# Patient Record
Sex: Female | Born: 1950 | Race: White | Hispanic: No | Marital: Married | Smoking: Never smoker
Health system: Southern US, Community
[De-identification: ages and names within clinical notes are randomized; demographics above are authoritative.]

## PROBLEM LIST (undated history)

## (undated) DIAGNOSIS — K7689 Other specified diseases of liver: Secondary | ICD-10-CM

## (undated) DIAGNOSIS — E785 Hyperlipidemia, unspecified: Secondary | ICD-10-CM

## (undated) DIAGNOSIS — N83209 Unspecified ovarian cyst, unspecified side: Secondary | ICD-10-CM

## (undated) DIAGNOSIS — Z87442 Personal history of urinary calculi: Secondary | ICD-10-CM

## (undated) DIAGNOSIS — Z973 Presence of spectacles and contact lenses: Secondary | ICD-10-CM

## (undated) DIAGNOSIS — T8859XA Other complications of anesthesia, initial encounter: Secondary | ICD-10-CM

## (undated) DIAGNOSIS — I10 Essential (primary) hypertension: Secondary | ICD-10-CM

## (undated) DIAGNOSIS — M1990 Unspecified osteoarthritis, unspecified site: Secondary | ICD-10-CM

## (undated) DIAGNOSIS — K219 Gastro-esophageal reflux disease without esophagitis: Secondary | ICD-10-CM

## (undated) DIAGNOSIS — N200 Calculus of kidney: Secondary | ICD-10-CM

## (undated) HISTORY — PX: OTHER SURGICAL HISTORY: SHX169

## (undated) HISTORY — PX: TOTAL KNEE ARTHROPLASTY: SHX125

## (undated) HISTORY — PX: CATARACT EXTRACTION W/ INTRAOCULAR LENS  IMPLANT, BILATERAL: SHX1307

---

## 1990-02-20 HISTORY — PX: KNEE ARTHROSCOPY: SUR90

## 1998-02-20 HISTORY — PX: VAGINAL HYSTERECTOMY: SUR661

## 1998-03-18 ENCOUNTER — Inpatient Hospital Stay (HOSPITAL_COMMUNITY): Admission: RE | Admit: 1998-03-18 | Discharge: 1998-03-19 | Payer: Self-pay | Admitting: Obstetrics and Gynecology

## 2006-08-20 ENCOUNTER — Encounter: Admission: RE | Admit: 2006-08-20 | Discharge: 2006-08-20 | Payer: Self-pay | Admitting: Anesthesiology

## 2006-08-20 ENCOUNTER — Encounter: Admission: RE | Admit: 2006-08-20 | Discharge: 2006-08-20 | Payer: Self-pay | Admitting: Obstetrics and Gynecology

## 2007-08-22 ENCOUNTER — Ambulatory Visit: Payer: Self-pay | Admitting: Internal Medicine

## 2008-10-20 ENCOUNTER — Ambulatory Visit: Payer: Self-pay | Admitting: Internal Medicine

## 2009-10-27 ENCOUNTER — Ambulatory Visit: Payer: Self-pay | Admitting: Internal Medicine

## 2010-03-13 ENCOUNTER — Encounter: Payer: Self-pay | Admitting: Podiatry

## 2010-10-10 ENCOUNTER — Encounter (HOSPITAL_BASED_OUTPATIENT_CLINIC_OR_DEPARTMENT_OTHER)
Admission: RE | Admit: 2010-10-10 | Discharge: 2010-10-10 | Disposition: A | Discharge status: Home / Self Care | Payer: BC Managed Care – PPO | Source: Ambulatory Visit | Attending: Orthopedic Surgery | Admitting: Orthopedic Surgery

## 2010-10-10 LAB — BASIC METABOLIC PANEL
BUN: 16 mg/dL (ref 6–23)
CO2: 30 mEq/L (ref 19–32)
GFR calc non Af Amer: >60 mL/min (ref >60)

## 2010-10-11 ENCOUNTER — Ambulatory Visit (HOSPITAL_BASED_OUTPATIENT_CLINIC_OR_DEPARTMENT_OTHER)
Admission: RE | Admit: 2010-10-11 | Discharge: 2010-10-11 | Disposition: A | Discharge status: Home / Self Care | Payer: BC Managed Care – PPO | Source: Ambulatory Visit | Attending: Orthopedic Surgery | Admitting: Orthopedic Surgery

## 2010-10-11 DIAGNOSIS — Z01810 Encounter for preprocedural cardiovascular examination: Secondary | ICD-10-CM | POA: Insufficient documentation

## 2010-10-11 DIAGNOSIS — X58XXXS Exposure to other specified factors, sequela: Secondary | ICD-10-CM | POA: Insufficient documentation

## 2010-10-11 DIAGNOSIS — IMO0002 Reserved for concepts with insufficient information to code with codable children: Secondary | ICD-10-CM | POA: Insufficient documentation

## 2010-10-11 DIAGNOSIS — T148XXS Other injury of unspecified body region, sequela: Secondary | ICD-10-CM | POA: Insufficient documentation

## 2010-10-11 DIAGNOSIS — L608 Other nail disorders: Secondary | ICD-10-CM | POA: Insufficient documentation

## 2010-10-11 DIAGNOSIS — Z01812 Encounter for preprocedural laboratory examination: Secondary | ICD-10-CM | POA: Insufficient documentation

## 2010-10-11 DIAGNOSIS — I10 Essential (primary) hypertension: Secondary | ICD-10-CM | POA: Insufficient documentation

## 2010-10-11 LAB — POCT HEMOGLOBIN-HEMACUE: Hemoglobin: 15.8 g/dL — ABNORMAL HIGH (ref 12.0–15.0)

## 2010-11-15 ENCOUNTER — Ambulatory Visit: Payer: Self-pay | Admitting: Internal Medicine

## 2010-11-22 NOTE — Op Note (Signed)
  NAME:  Lauren Carter, FLOOR NO.:  [id number]  MEDICAL RECORD NO.:  [id number]  LOCATION:                                 FACILITY:  PHYSICIAN:  Cindee Salt, M.D.            DATE OF BIRTH:  DATE OF PROCEDURE: DATE OF DISCHARGE:                              OPERATIVE REPORT   PREOPERATIVE DIAGNOSIS:  Lesion, left index finger nail bed.  POSTOPERATIVE DIAGNOSIS:  Lesion, left index finger nail bed.  OPERATION:  Excision, mass reconstruction on nail bed, left index finger.  SURGEON:  Cindee Salt, MD  ASSISTANT:  [name] Loa, MD  ANESTHESIA:  Forearm based IV regional.  DATE OF OPERATION:  October 11, 2010  ANESTHESIOLOGIST:  Janetta Hora. Frederick, MD  HISTORY:  The patient is a 60 year old female who suffered a crush injury to her left index finger approximately 7 months ago.  This has not healed.  She has a split nail.  She is desirous of attempted reconstruction with a area of growth through the central aspect looking like pyogenic granuloma.  Pre, peri, and postoperative course have been discussed along with risks and complications.  She is aware there is no guarantee with surgery, possibility of infection, recurrence of injury to arteries, nerves, tendons, incomplete relief of symptoms, continued deformity to the nail plate with overlapping or splitting.  In preoperative area, the patient was seen.  The extremity was marked by both the patient and surgeon.  Antibiotic given.  PROCEDURE:  The patient was brought to the operating room where a forearm based IV regional anesthetic was carried out without difficulty. She was prepped using ChloraPrep, supine position with the left arm free.  A 3-minute dry time was allowed.  Time-out taken confirming the patient procedure.  The remainder of the nail plate was removed.  The area of darkened pyogenic type tissue was excised.  This was a long ellipse covering the entire nail matrix.  The area was then  undermined, irrigated.  This was then closed with interrupted 6-0 chromic sutures on complete closure of the wound.  A sterile gauze was placed into the nail fold along with a second over the top.  A sterile compressive dressing splint to the finger was applied.  Deflation of the tourniquet. Remaining fingers pinked.  Prior to application of the dressing, a metacarpal block was given.  A 0.25% Marcaine without epinephrine of approximately 8 mL was used.  The patient tolerated the procedure well and was taken to the recovery room for observation in satisfactory condition.  She will be discharged home to return to the [facility] in 1 week on Vicodin.          ______________________________ Cindee Salt, M.D.     GK/MEDQ  D:  10/11/2010  T:  10/11/2010  Job:  [id number]  Electronically Signed by Cindee Salt M.D. on 11/22/2010 04:38:21 PM

## 2011-11-16 ENCOUNTER — Ambulatory Visit: Payer: Self-pay | Admitting: Internal Medicine

## 2012-11-18 ENCOUNTER — Ambulatory Visit: Payer: Self-pay | Admitting: Internal Medicine

## 2013-11-24 ENCOUNTER — Ambulatory Visit: Payer: Self-pay | Admitting: Family Medicine

## 2014-03-18 ENCOUNTER — Ambulatory Visit: Payer: Self-pay | Admitting: Family Medicine

## 2014-04-01 ENCOUNTER — Ambulatory Visit: Payer: Self-pay | Admitting: Surgery

## 2014-04-14 ENCOUNTER — Inpatient Hospital Stay: Payer: Self-pay | Admitting: Surgery

## 2014-06-03 ENCOUNTER — Ambulatory Visit
Admit: 2014-06-03 | Disposition: A | Discharge status: Home / Self Care | Payer: Self-pay | Attending: Surgery | Admitting: Surgery

## 2014-06-21 NOTE — Op Note (Signed)
PATIENT NAME:  Lauren Carter, Lauren Carter MR#:  [id number] DATE OF BIRTH:  11-25-1950  DATE OF PROCEDURE:  04/14/2014  PREOPERATIVE DIAGNOSIS: Degenerative joint disease, left knee.   POSTOPERATIVE DIAGNOSIS: Degenerative joint disease, left knee.   PROCEDURE: Left total knee arthroplasty using an all-cemented Biomet Vanguard system with a 65 mm posterior cruciate retaining femoral component, a 67 mm tibial tray with a 10 mm E-polyethylene insert, and a 9 x 34 mm all-polyethylene three-pegged domed patella.   SURGEON: Pascal Lux, M.D.   ASSISTANT: Francena Hanly, NP  ANESTHESIA: Spinal.   FINDINGS: As noted above.   COMPLICATIONS: None.   ESTIMATED BLOOD LOSS: Less than 25 mL.  TOTAL FLUIDS: 1800 mL of crystalloid.   URINE OUTPUT: 100 mL.  TOURNIQUET TIME: 103 minutes at 300 mmHg.   DRAINS: None.   CLOSURE: Staples.   BRIEF CLINICAL NOTE: The patient is a 64 year old female with a several year history of progressively worsening left knee pain. Her symptoms have persisted despite medications, activity modification, injections, etc. Her history and examination are consistent with degenerative joint disease, confirmed by plain radiographs. She presents at this time for a left total knee arthroplasty.   PROCEDURE IN DETAIL: The patient was brought into the operating room. After adequate spinal anesthesia was obtained, she was lain in the supine position and a Foley catheter inserted. Her left lower extremity was prepped with ChloraPrep solution before being draped sterilely. Preoperative antibiotics were administered. The limb was exsanguinated with an Esmarch and the tourniquet inflated to 300 mmHg. A standard anterior approach to the knee was made through an approximately 6 to 7 inch incision. The incision was carried down through the subcutaneous tissues to expose the superficial retinaculum. This was split the length of the incision and the flap elevated sufficiently to expose the medial  retinaculum. The retinaculum was incised, leaving a 3 to 4 mm cuff of tissue along the medial border of the patella. This was extended distally along the medial border of the patellar tendon and proximally through the medial third of the quadriceps tendon. A subtotal fat pad excision was performed before the anterior portions of the medial and lateral menisci were removed, as was the anterior cruciate ligament. The soft tissues were elevated off the anteromedial aspect of the proximal tibia to the level of the collateral ligaments. With the knee flexed at 90 degrees, the external tibial guide was positioned and the appropriate tibial cut made. The piece was taken to the back table where it was measured and found to be optimally replicated by a 67 mm component.   Attention was directed to the distal femur. The intramedullary canal was accessed through a 3/8 inch drill hole. The intramedullary guide was positioned to ensure a neutral flexion gap. The anterior cutting guide was positioned and, after verifying that the anterior cortex would not be notched, the appropriate anterior cut was made. The distal cutting block was positioned, incorporating 6 degrees of valgus alignment. The distal femoral cut was made. The distal femur was measured and found to be optimally replicated by a 65 mm component. Therefore, the 65 mm 4-in-1 cutting block was positioned in first the posterior, then the posterior chamfer, the anterior chamfer, and finally the anterior cuts were made. At this point, the posterior portions of the medial and lateral menisci were removed.   A trial reduction was performed using the 65 mm posterior cruciate retaining femoral component, the 67 mm tibial tray, and the 10 mm insert. The 10 mm  insert demonstrated excellent stability to varus and valgus stressing, both in flexion and extension, and permitted full extension. Patella tracking was assessed and found to be excellent, so the tibial tray was  temporarily secured using headed pins. The patellar thickness was measured and found to be 22 mm; therefore, a 9 mm cut was made. The surface was measured and found to be optimally replicated by a 34 mm component. Therefore, the three-pegged holes corresponding to the 34 mm insert were drilled, and the trial patellar button inserted. Patella tracking again was assessed and found to be excellent, easily passing the "no thumb test". The lug holes were drilled into the distal femur before the trial components were removed. The tibial tray was replaced by another tibial tray that would enable the cruciate punch to be used. This was performed to establish the appropriate keel for the tibial component.   The bony surfaces were prepared for cementing by irrigating them thoroughly with bacitracin saline solution via the jet lavage system, then packing them with a dry lap sponge. Prior to irrigation, a total of 20 mL of Exparel diluted out to 60 mL with normal saline and 30 mL of 0.5% Sensorcaine was injected into the posteromedial and posterolateral capsular regions, the medial and lateral gutter regions, and the peri-incisional tissues to help with postoperative analgesia. In addition, a plug was fashioned from some of the bone that had been removed previously and used to plug the distal femoral canal. Meanwhile, cement was being mixed on the back table. When the cement was ready, the tibial tray was cemented in first. The excess cement was removed using [employer] fast streamer. Next, the femoral component was impacted into place. Again, the excess cement was removed using [employer] fast streamer. The 10 mm trial insert was positioned and the knee brought into extension while the cement hardened. Finally, the patella was cemented into place and secured using the patellar clamp. Again, the excess cement was removed using [employer] fast streamer. Once the cement had hardened, the knee was placed through a range of motion with the findings as  described above. Therefore, the trial tibial tray was removed and, after verifying that no cement had been retained posteriorly, the permanent 10 mm E-polyethylene insert was positioned and secured using the appropriate key locking mechanism. Again, the knee was placed through a range of motion with the findings as described above.   The wound was copiously irrigated with bacitracin saline solution via the jet lavage system before the quadriceps tendon and retinacular layer were reapproximated using #0 Vicryl interrupted sutures. The superficial retinacular layer was closed using running #0 Vicryl suture. The subcutaneous tissues were closed in two layers using 2-0 Vicryl interrupted sutures. The skin was closed using staples. A sterile occlusive dressing was applied to the knee before the patient was awakened and returned to the recovery room in satisfactory condition after tolerating the procedure well.   Of note, 1 gram of tranexamic acid in 10 mL of normal saline was injected intra-articularly after closure of the retinacular layer to help with postoperative bleeding.   ____________________________ Lenna Sciara. Dorien Chihuahua, MD jjp:sb D: 04/14/2014 10:23:49 ET T: 04/14/2014 12:29:12 ET JOB#: [id number]  cc: Pascal Lux, MD, <Dictator> Pascal Lux MD ELECTRONICALLY SIGNED 04/14/2014 15:28

## 2014-06-21 NOTE — Discharge Summary (Signed)
PATIENT NAME:  Lauren Carter, Lauren Carter MR#:  [id number] DATE OF BIRTH:  [birth year]  DATE OF ADMISSION:  04/14/2014 DATE OF DISCHARGE:  04/17/2014  ADMITTING DIAGNOSIS: Degenerative arthrosis of the left knee.   DISCHARGE DIAGNOSIS: Degenerative arthrosis of the left knee.   OPERATION: On 04/14/2014, the patient had a left total knee arthroplasty using cemented Biomet Vanguard system with 65 mm posterior cruciate retaining femoral component, 67 mm tibial tray with 10 mm E polyethylene insert and 9 x 34 mm all polyethylene three-pegged dome patella.   SURGEON: Josefina Do, MD.  ASSISTANT: April Tretha Sciara, NP.  ANESTHESIA: Spinal.   COMPLICATIONS: None.   ESTIMATED BLOOD LOSS: 25 mL.  The patient was then stabilized, brought to the recovery room, and then brought down to the orthopedic floor.   HISTORY: The patient is a 64 year old female who presented for persistent pain involving the left knee being refractory to conservative treatment. The patient has tried an arthroscopy as well as cortisone injections with no relief.   PHYSICAL EXAMINATION: GENERAL: Alert female with an antalgic gait with slight limping component to the left side.  HEART: Regular rate and rhythm.  LUNGS: Clear to auscultation.  MUSCULOSKELETAL: In regard to the left knee, the patient has -3 degrees extension to 95 degrees flexion with pain with flexion. The patient is tender along the medial joint line. The patient has no instability of the knee. There is no effusion noted.   HOSPITAL COURSE: After initial admission on April 14, 2014, the patient was brought to the orthopedic floor. On postoperative day 1, the patient had a hemoglobin of 11.7 and then on postoperative day 2 the patient's hemoglobin was still at 11.7 with no transfusion given. The patient did physical therapy, initially bed to chair and progressed up to ambulating around the nurse's station, including doing stairs. The patient was ready to go home with home  health physical therapy on April 17, 2014.   CONDITION AT DISCHARGE: Stable.   DISPOSITION: The patient was sent home with home health physical therapy.   DISCHARGE INSTRUCTIONS: The patient will do weight bear as tolerated on the effected leg. The patient will use knee-high TED hose on both legs, removed at bedtime, and then elevate the heels off the bed as well as use incentive spirometer and be encouraged to do cough and deep breathing. The patient's diet is regular. The patient will use Polar Care to decrease swelling, try not to get her dressing dirty or wet. The patient will keep her dressing on unless changed by physical therapy. The patient will call the clinic if there is any bright red bleeding, calf pain, bowel or bladder difficulty, or any fever greater than 101.5. The patient will do home health physical therapy working on gait training and range of motion activities. The patient will follow up with [facility] in about 2 weeks for staple removal.   DISCHARGE MEDICATIONS: Resume home medications and then to add Tylenol 500 mg 1 tablet q. 4 hours as needed for fever or pain, oxycodone 5 mg 1 tablet q. 4 hours as needed for moderate to severe pain, and tramadol 50 mg 1 tablet every 8 hours or as needed for pain. The patient will also use Lovenox 40 mg once a day for 14 days and then discontinue and start an aspirin 81 mg once a day. The patient will also use bisacodyl 10 mg rectally or Senokot-S 1 tablet twice a day as needed for constipation.  ____________________________ Lenna Sciara. Reche Dixon, [place] jtm:sb  D: 04/17/2014 06:19:06 ET T: 04/17/2014 16:15:19 ET JOB#: [id number]  cc: J. Reche Dixon, [place], <Dictator> J Quentina Fronek [facility] PA ELECTRONICALLY SIGNED 04/20/2014 8:40

## 2014-09-11 ENCOUNTER — Other Ambulatory Visit
Admission: RE | Admit: 2014-09-11 | Discharge: 2014-09-11 | Disposition: A | Discharge status: Home / Self Care | Payer: BC Managed Care – PPO | Source: Ambulatory Visit | Attending: Family Medicine | Admitting: Family Medicine

## 2014-09-11 DIAGNOSIS — Z96652 Presence of left artificial knee joint: Secondary | ICD-10-CM | POA: Insufficient documentation

## 2014-09-11 LAB — SYNOVIAL CELL COUNT + DIFF, W/ CRYSTALS
Crystals, Fluid: NONE SEEN
Eosinophils-Synovial: 0 % (ref 0–1)
LYMPHOCYTES-SYNOVIAL FLD: 29 % — AB (ref 0–20)
Monocyte-Macrophage-Synovial Fluid: 19 % — ABNORMAL LOW (ref 50–90)
NEUTROPHIL, SYNOVIAL: 52 % — AB (ref 0–25)
Other Cells-SYN: 0
WBC, SYNOVIAL: 332 /mm3 — AB (ref 0–200)

## 2014-09-15 LAB — BODY FLUID CULTURE
Culture: NO GROWTH
Gram Stain: NONE SEEN

## 2014-11-05 ENCOUNTER — Other Ambulatory Visit: Payer: Self-pay | Admitting: Family Medicine

## 2014-11-05 DIAGNOSIS — Z1231 Encounter for screening mammogram for malignant neoplasm of breast: Secondary | ICD-10-CM

## 2014-11-26 ENCOUNTER — Ambulatory Visit: Payer: BC Managed Care – PPO

## 2014-11-30 ENCOUNTER — Ambulatory Visit
Admission: RE | Admit: 2014-11-30 | Discharge: 2014-11-30 | Disposition: A | Discharge status: Home / Self Care | Payer: BC Managed Care – PPO | Source: Ambulatory Visit | Attending: Family Medicine | Admitting: Family Medicine

## 2014-11-30 DIAGNOSIS — Z1231 Encounter for screening mammogram for malignant neoplasm of breast: Secondary | ICD-10-CM | POA: Insufficient documentation

## 2015-11-09 ENCOUNTER — Other Ambulatory Visit: Payer: Self-pay | Admitting: Family Medicine

## 2015-11-09 DIAGNOSIS — Z1231 Encounter for screening mammogram for malignant neoplasm of breast: Secondary | ICD-10-CM

## 2015-12-06 ENCOUNTER — Ambulatory Visit
Admission: RE | Admit: 2015-12-06 | Discharge: 2015-12-06 | Disposition: A | Discharge status: Home / Self Care | Payer: BC Managed Care – PPO | Source: Ambulatory Visit | Attending: Family Medicine | Admitting: Family Medicine

## 2015-12-06 ENCOUNTER — Other Ambulatory Visit: Payer: Self-pay | Admitting: Family Medicine

## 2015-12-06 DIAGNOSIS — Z1231 Encounter for screening mammogram for malignant neoplasm of breast: Secondary | ICD-10-CM | POA: Diagnosis present

## 2016-02-21 HISTORY — PX: HAMMER TOE SURGERY: SHX385

## 2016-07-24 ENCOUNTER — Other Ambulatory Visit: Payer: Self-pay | Admitting: Otolaryngology

## 2016-07-24 DIAGNOSIS — H9202 Otalgia, left ear: Secondary | ICD-10-CM

## 2016-07-24 DIAGNOSIS — H9041 Sensorineural hearing loss, unilateral, right ear, with unrestricted hearing on the contralateral side: Secondary | ICD-10-CM

## 2016-07-31 ENCOUNTER — Ambulatory Visit: Payer: BC Managed Care – PPO

## 2016-08-02 ENCOUNTER — Ambulatory Visit
Admission: RE | Admit: 2016-08-02 | Discharge: 2016-08-02 | Disposition: A | Discharge status: Home / Self Care | Payer: Medicare Other | Source: Ambulatory Visit | Attending: Otolaryngology | Admitting: Otolaryngology

## 2016-08-02 DIAGNOSIS — H9041 Sensorineural hearing loss, unilateral, right ear, with unrestricted hearing on the contralateral side: Secondary | ICD-10-CM | POA: Insufficient documentation

## 2016-08-02 DIAGNOSIS — H9202 Otalgia, left ear: Secondary | ICD-10-CM | POA: Diagnosis present

## 2016-08-02 LAB — POCT I-STAT CREATININE: CREATININE: 0.7 mg/dL (ref 0.44–1.00)

## 2016-08-02 MED ORDER — GADOBENATE DIMEGLUMINE 529 MG/ML IV SOLN
15.0000 mL | Freq: Once | INTRAVENOUS | Status: AC | PRN
  Administered 2016-08-02: 15 mL via INTRAVENOUS

## 2016-10-03 ENCOUNTER — Other Ambulatory Visit: Payer: Self-pay | Admitting: Family Medicine

## 2016-10-03 DIAGNOSIS — M545 Low back pain, unspecified: Secondary | ICD-10-CM

## 2016-10-03 DIAGNOSIS — R310 Gross hematuria: Secondary | ICD-10-CM

## 2016-10-04 ENCOUNTER — Ambulatory Visit
Admission: RE | Admit: 2016-10-04 | Discharge: 2016-10-04 | Disposition: A | Discharge status: Home / Self Care | Payer: Medicare Other | Source: Ambulatory Visit | Attending: Family Medicine | Admitting: Family Medicine

## 2016-10-04 DIAGNOSIS — N202 Calculus of kidney with calculus of ureter: Secondary | ICD-10-CM | POA: Diagnosis not present

## 2016-10-04 DIAGNOSIS — M545 Low back pain, unspecified: Secondary | ICD-10-CM

## 2016-10-04 DIAGNOSIS — N83201 Unspecified ovarian cyst, right side: Secondary | ICD-10-CM | POA: Diagnosis not present

## 2016-10-04 DIAGNOSIS — R310 Gross hematuria: Secondary | ICD-10-CM | POA: Diagnosis present

## 2016-10-04 HISTORY — DX: Essential (primary) hypertension: I10

## 2016-10-04 MED ORDER — IOPAMIDOL (ISOVUE-300) INJECTION 61%
100.0000 mL | Freq: Once | INTRAVENOUS | Status: AC | PRN
  Administered 2016-10-04: 100 mL via INTRAVENOUS

## 2016-10-26 IMAGING — MR MRI OF THE LEFT KNEE WITHOUT CONTRAST
6 series · 37 of 40 positions shown · non-contrast
Comparison: None.

CLINICAL DATA: Anterior and medial left knee pain for 1 month. No
known injury.

EXAM:
MRI OF THE LEFT KNEE WITHOUT CONTRAST
TECHNIQUE: Multiplanar, multisequence MR imaging of the knee was performed. No
intravenous contrast was administered.

[Series 3: PD fat-sat · axial · 3.0mm · 0.50mm/px · z∈[-47,+65]mm · 8 of 35 slices shown (1 of 4)]
[im 1/35]
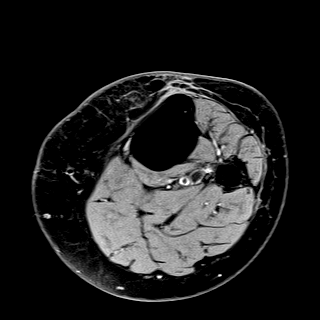
[im 4/35]
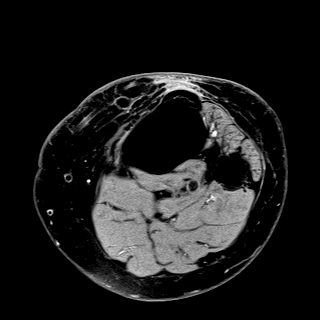
[im 12/35]
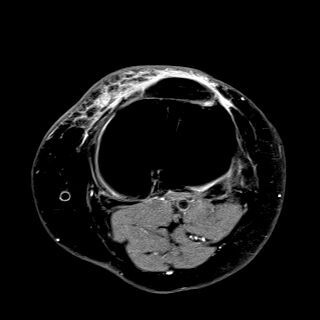
[im 16/35]
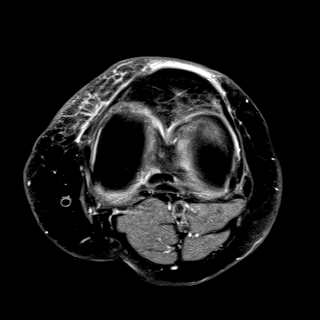
[im 19/35]
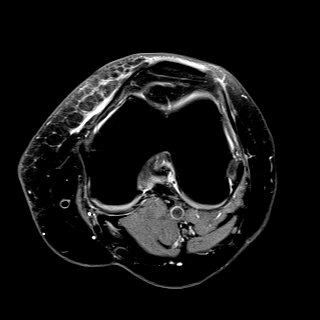
[im 23/35]
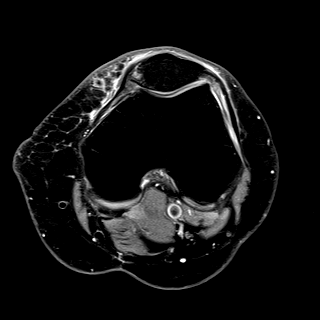
[im 31/35]
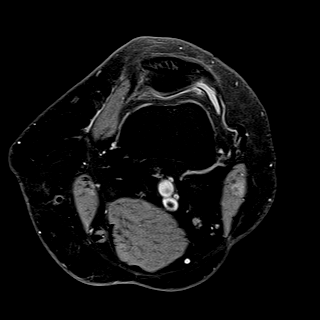
[im 35/35]
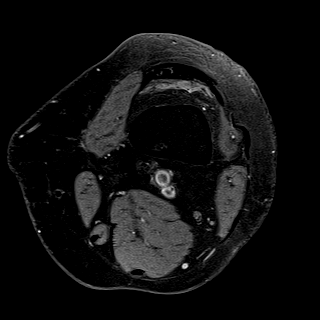

[Series 4: T1 · coronal · 3.0mm · 0.50mm/px · 6 of 29 slices shown]
[im 1/29]
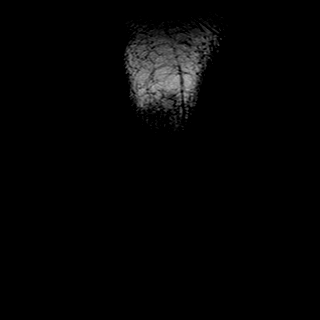
[im 5/29]
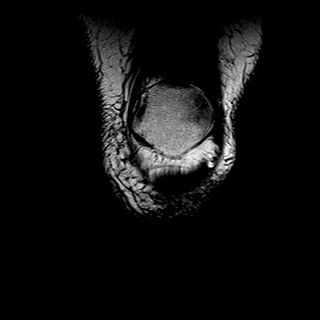
[im 10/29]
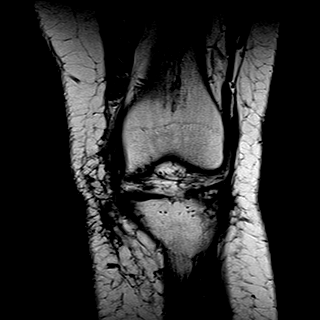
[im 15/29]
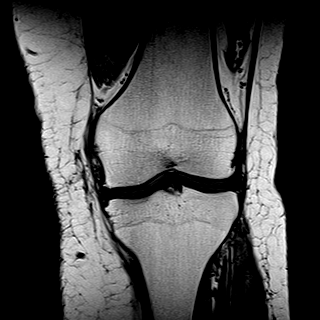
[im 19/29]
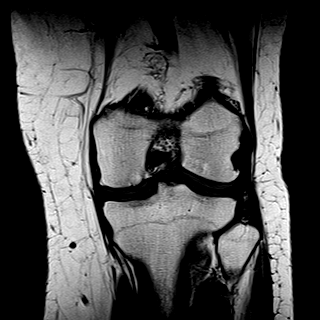
[im 24/29]
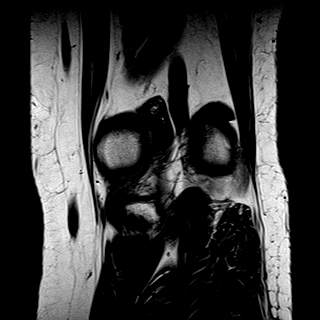

[Series 5: PD fat-sat · sagittal · 3.0mm · 0.50mm/px · 7 of 29 slices shown (2 of 4)]
[im 1/29]
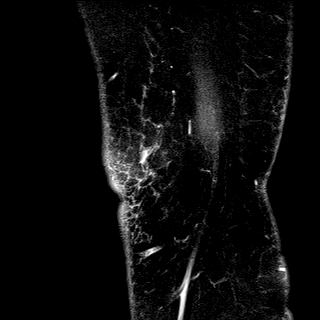
[im 5/29]
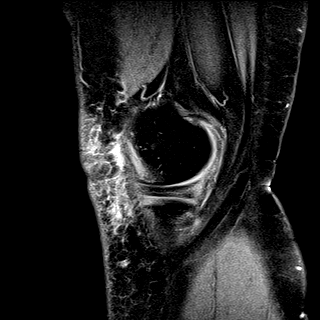
[im 10/29]
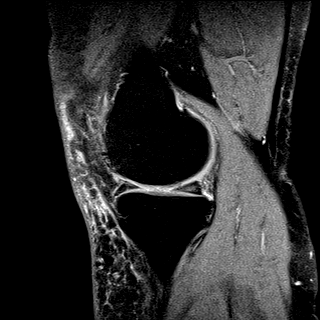
[im 15/29]
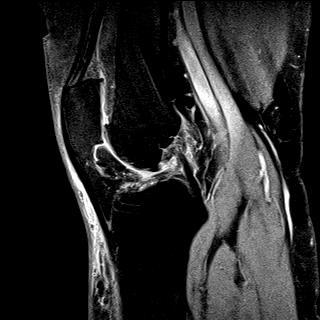
[im 19/29]
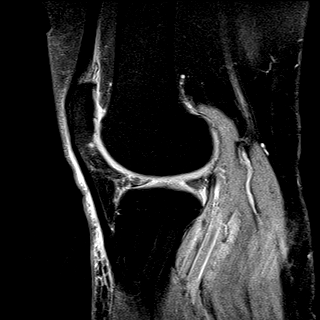
[im 24/29]
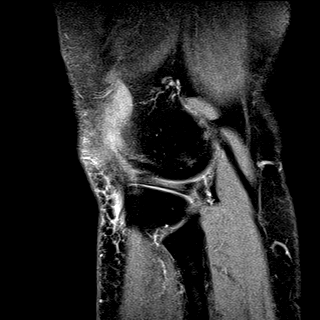
[im 29/29]
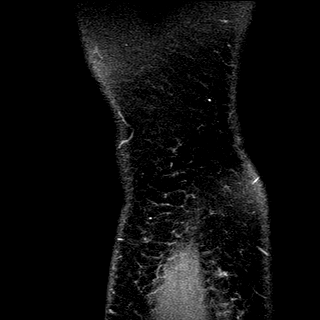

[Series 6: T2 fat-sat · coronal · 3.0mm · 0.31mm/px · 7 of 29 slices shown]
[im 1/29]
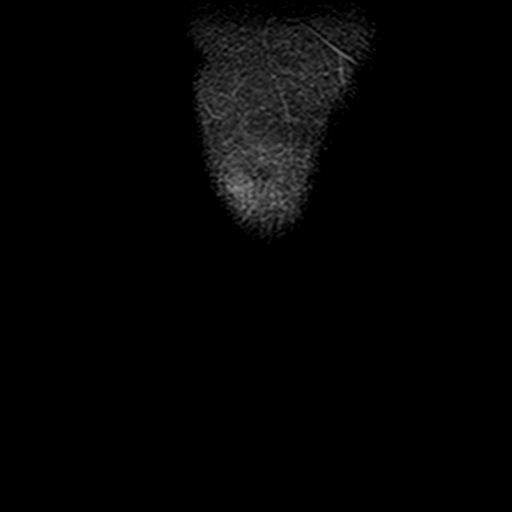
[im 5/29]
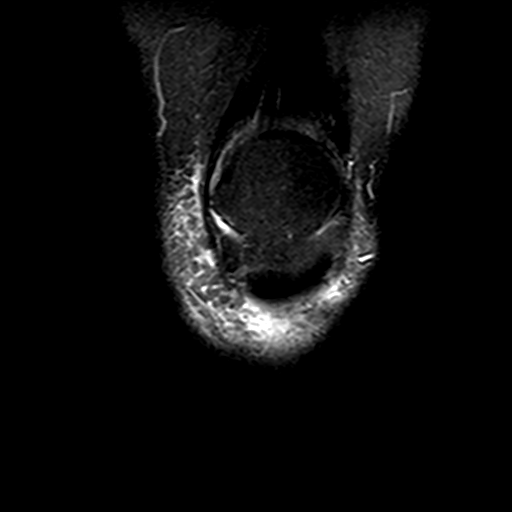
[im 10/29]
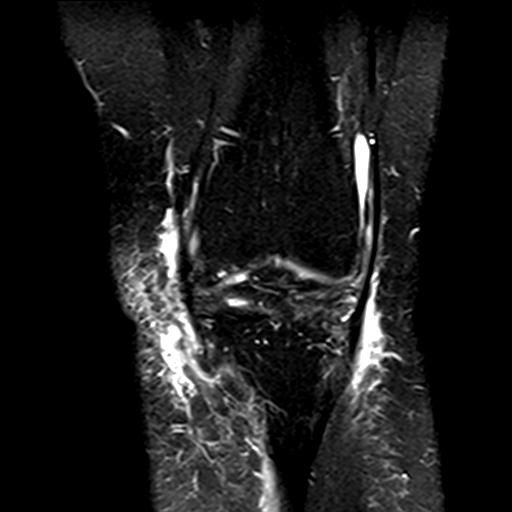
[im 15/29]
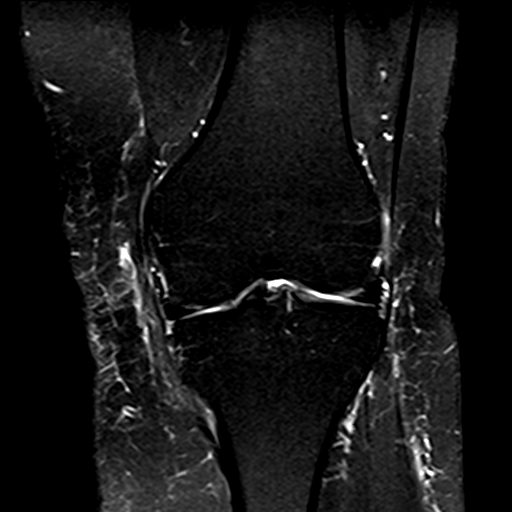
[im 19/29]
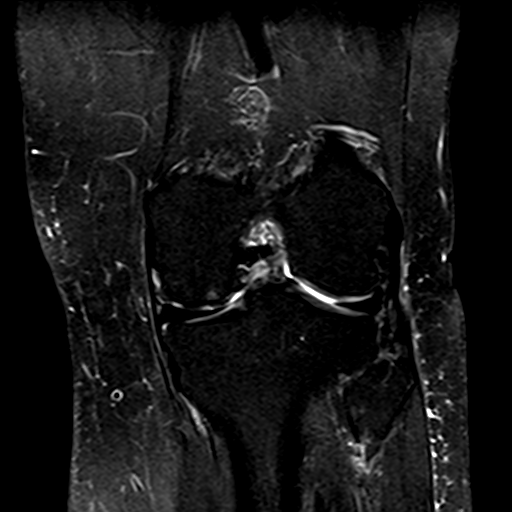
[im 24/29]
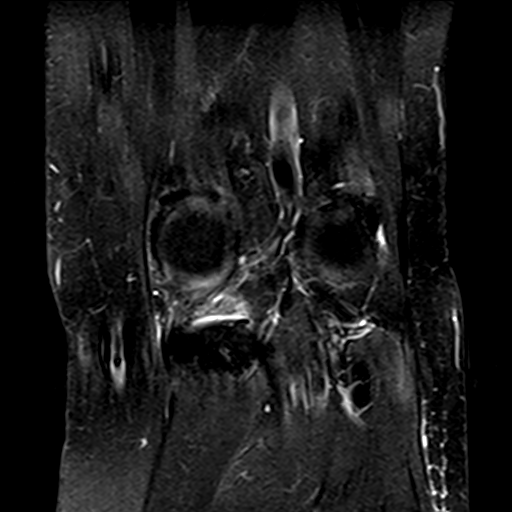
[im 29/29]
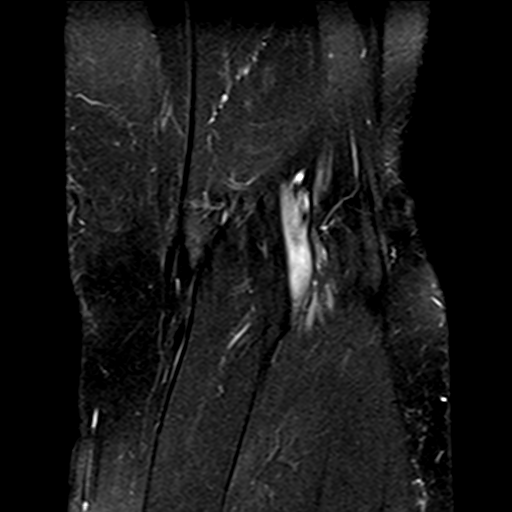

[Series 7: PD fat-sat · coronal · 3.0mm · 0.50mm/px · 7 of 29 slices shown (3 of 4)]
[im 1/29]
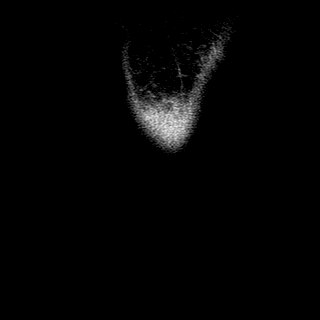
[im 5/29]
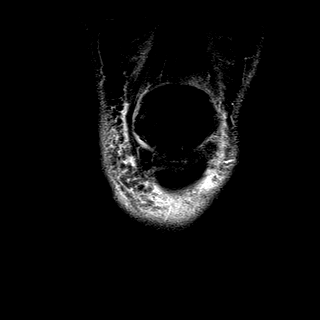
[im 10/29]
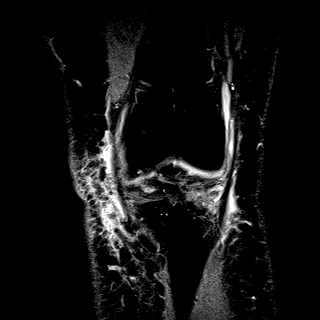
[im 15/29]
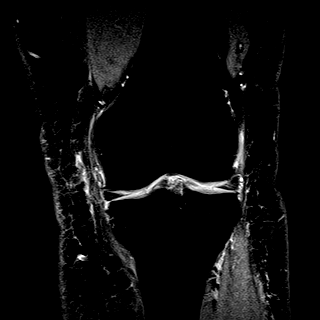
[im 19/29]
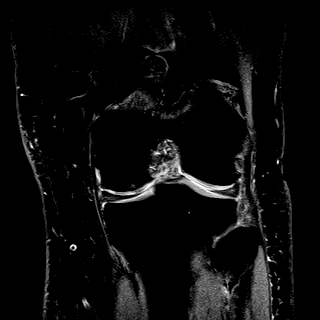
[im 24/29]
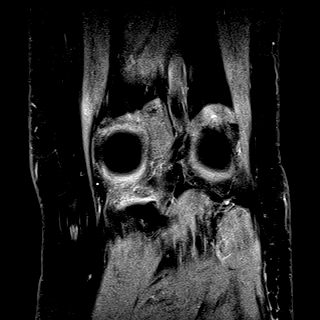
[im 29/29]
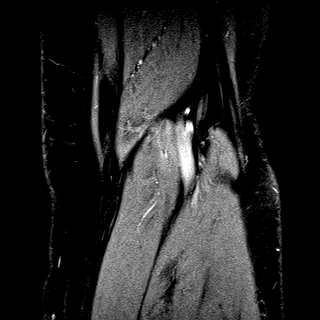

[Series 8: PD fat-sat · oblique · 2.0mm · 0.62mm/px · 2 of 9 slices shown (4 of 4)]
[im 1/9]
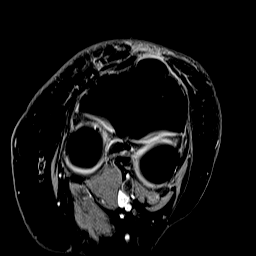
[im 9/9]
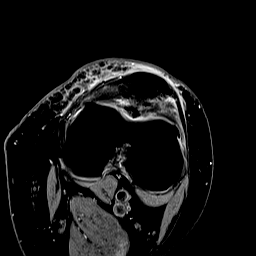

[37 of 40 positions shown; findings below may reference images not displayed]

FINDINGS: MENISCI

Medial meniscus: Extensive degenerative signal is seen throughout
the posterior horn of the medial meniscus but no meniscal tear is
identified.

Lateral meniscus: Degenerative signal is seen throughout the lateral
meniscus without tear identified.

LIGAMENTS

Cruciates: The anterior cruciate ligament is completely torn. The
posterior cruciate ligament is intact.

Collaterals:  Intact.

CARTILAGE

Patellofemoral:  Mildly degenerated.

Medial:  Mildly degenerated.

Lateral:  Unremarkable.

Joint:  Trace amount of joint fluid is present.

Popliteal Fossa:  Unremarkable.

Extensor Mechanism:  Intact.

Bones:  Small osteophytes are seen about the knee.
IMPRESSION: Chronic, complete ACL tear.

Fairly extensive degenerative signal is seen in both the medial and
lateral menisci without tear identified.

Mild appearing degenerative disease about the knee.

## 2016-11-15 ENCOUNTER — Other Ambulatory Visit: Payer: Self-pay | Admitting: Family Medicine

## 2016-11-15 DIAGNOSIS — Z1231 Encounter for screening mammogram for malignant neoplasm of breast: Secondary | ICD-10-CM

## 2016-12-08 ENCOUNTER — Ambulatory Visit
Admission: RE | Admit: 2016-12-08 | Discharge: 2016-12-08 | Disposition: A | Discharge status: Home / Self Care | Payer: Medicare Other | Source: Ambulatory Visit | Attending: Family Medicine | Admitting: Family Medicine

## 2016-12-08 DIAGNOSIS — Z1231 Encounter for screening mammogram for malignant neoplasm of breast: Secondary | ICD-10-CM | POA: Insufficient documentation

## 2017-01-31 ENCOUNTER — Other Ambulatory Visit: Payer: Self-pay | Admitting: Urology

## 2017-02-14 ENCOUNTER — Encounter (HOSPITAL_BASED_OUTPATIENT_CLINIC_OR_DEPARTMENT_OTHER): Payer: Self-pay | Admitting: *Deleted

## 2017-02-14 ENCOUNTER — Other Ambulatory Visit: Payer: Self-pay

## 2017-02-14 NOTE — Progress Notes (Signed)
SPOKE W/ PT VIA PHONE FOR PRE-OP INTERVIEW.  NPO AFTER MN W/ EXCEPTION CLEAR LIQUIDS UNTIL 0900 (NO CREAM/MILK PRODUCTS).  ARRIVE AT 1300.  NEEDS ISTAT AND EKG.  WILL TAKE PRILOSEC AM DOS W/SIPS OF WATER AND IF NEEDED TAKE PERCOCET.

## 2017-02-26 ENCOUNTER — Encounter (HOSPITAL_BASED_OUTPATIENT_CLINIC_OR_DEPARTMENT_OTHER): Payer: Self-pay | Admitting: *Deleted

## 2017-02-26 ENCOUNTER — Other Ambulatory Visit: Payer: Self-pay

## 2017-02-26 ENCOUNTER — Ambulatory Visit (HOSPITAL_BASED_OUTPATIENT_CLINIC_OR_DEPARTMENT_OTHER): Payer: Medicare Other | Admitting: Anesthesiology

## 2017-02-26 ENCOUNTER — Ambulatory Visit (HOSPITAL_BASED_OUTPATIENT_CLINIC_OR_DEPARTMENT_OTHER)
Admission: RE | Admit: 2017-02-26 | Discharge: 2017-02-26 | Disposition: A | Discharge status: Home / Self Care | Payer: Medicare Other | Source: Ambulatory Visit | Attending: Urology | Admitting: Urology

## 2017-02-26 ENCOUNTER — Encounter (HOSPITAL_BASED_OUTPATIENT_CLINIC_OR_DEPARTMENT_OTHER)
Admission: RE | Disposition: A | Discharge status: Home / Self Care | Payer: Self-pay | Source: Ambulatory Visit | Attending: Urology

## 2017-02-26 DIAGNOSIS — Z79899 Other long term (current) drug therapy: Secondary | ICD-10-CM | POA: Insufficient documentation

## 2017-02-26 DIAGNOSIS — Z90710 Acquired absence of both cervix and uterus: Secondary | ICD-10-CM | POA: Diagnosis not present

## 2017-02-26 DIAGNOSIS — K219 Gastro-esophageal reflux disease without esophagitis: Secondary | ICD-10-CM | POA: Insufficient documentation

## 2017-02-26 DIAGNOSIS — M1990 Unspecified osteoarthritis, unspecified site: Secondary | ICD-10-CM | POA: Diagnosis not present

## 2017-02-26 DIAGNOSIS — E785 Hyperlipidemia, unspecified: Secondary | ICD-10-CM | POA: Insufficient documentation

## 2017-02-26 DIAGNOSIS — K7689 Other specified diseases of liver: Secondary | ICD-10-CM | POA: Insufficient documentation

## 2017-02-26 DIAGNOSIS — N83209 Unspecified ovarian cyst, unspecified side: Secondary | ICD-10-CM | POA: Diagnosis not present

## 2017-02-26 DIAGNOSIS — Z96652 Presence of left artificial knee joint: Secondary | ICD-10-CM | POA: Insufficient documentation

## 2017-02-26 DIAGNOSIS — Z87442 Personal history of urinary calculi: Secondary | ICD-10-CM | POA: Insufficient documentation

## 2017-02-26 DIAGNOSIS — I10 Essential (primary) hypertension: Secondary | ICD-10-CM | POA: Diagnosis not present

## 2017-02-26 DIAGNOSIS — Z7982 Long term (current) use of aspirin: Secondary | ICD-10-CM | POA: Diagnosis not present

## 2017-02-26 DIAGNOSIS — N200 Calculus of kidney: Secondary | ICD-10-CM | POA: Insufficient documentation

## 2017-02-26 DIAGNOSIS — Z9889 Other specified postprocedural states: Secondary | ICD-10-CM | POA: Diagnosis not present

## 2017-02-26 DIAGNOSIS — Z803 Family history of malignant neoplasm of breast: Secondary | ICD-10-CM | POA: Insufficient documentation

## 2017-02-26 HISTORY — DX: Unspecified osteoarthritis, unspecified site: M19.90

## 2017-02-26 HISTORY — DX: Gastro-esophageal reflux disease without esophagitis: K21.9

## 2017-02-26 HISTORY — PX: HOLMIUM LASER APPLICATION: SHX5852

## 2017-02-26 HISTORY — DX: Unspecified ovarian cyst, unspecified side: N83.209

## 2017-02-26 HISTORY — DX: Presence of spectacles and contact lenses: Z97.3

## 2017-02-26 HISTORY — DX: Calculus of kidney: N20.0

## 2017-02-26 HISTORY — PX: CYSTOSCOPY WITH RETROGRADE PYELOGRAM, URETEROSCOPY AND STENT PLACEMENT: SHX5789

## 2017-02-26 HISTORY — DX: Other specified diseases of liver: K76.89

## 2017-02-26 HISTORY — DX: Hyperlipidemia, unspecified: E78.5

## 2017-02-26 LAB — POCT I-STAT 4, (NA,K, GLUC, HGB,HCT)
GLUCOSE: 88 mg/dL (ref 65–99)
HCT: 41 % (ref 36.0–46.0)
Hemoglobin: 13.9 g/dL (ref 12.0–15.0)
POTASSIUM: 4.2 mmol/L (ref 3.5–5.1)
Sodium: 142 mmol/L (ref 135–145)

## 2017-02-26 SURGERY — CYSTOURETEROSCOPY, WITH RETROGRADE PYELOGRAM AND STENT INSERTION
Anesthesia: General | Laterality: Left

## 2017-02-26 MED ORDER — MIDAZOLAM HCL 5 MG/5ML IJ SOLN
INTRAMUSCULAR | Status: DC | PRN
  Administered 2017-02-26: 2 mg via INTRAVENOUS

## 2017-02-26 MED ORDER — CEFAZOLIN SODIUM-DEXTROSE 2-4 GM/100ML-% IV SOLN
INTRAVENOUS | Status: AC
  Filled 2017-02-26: qty 100

## 2017-02-26 MED ORDER — CEFAZOLIN SODIUM-DEXTROSE 2-4 GM/100ML-% IV SOLN
2.0000 g | INTRAVENOUS | Status: AC
  Administered 2017-02-26: 2 g via INTRAVENOUS
  Filled 2017-02-26: qty 100

## 2017-02-26 MED ORDER — OXYCODONE HCL 5 MG PO TABS
5.0000 mg | ORAL_TABLET | Freq: Once | ORAL | Status: AC | PRN
  Administered 2017-02-26: 5 mg via ORAL
  Filled 2017-02-26: qty 1

## 2017-02-26 MED ORDER — OXYCODONE-ACETAMINOPHEN 5-325 MG PO TABS: 1.0000 | ORAL_TABLET | ORAL | 0 refills | Status: DC | PRN

## 2017-02-26 MED ORDER — MIDAZOLAM HCL 2 MG/2ML IJ SOLN
INTRAMUSCULAR | Status: AC
  Filled 2017-02-26: qty 2

## 2017-02-26 MED ORDER — LACTATED RINGERS IV SOLN
INTRAVENOUS | Status: DC
  Administered 2017-02-26 (×2): via INTRAVENOUS
  Filled 2017-02-26: qty 1000

## 2017-02-26 MED ORDER — HYDROMORPHONE HCL 1 MG/ML IJ SOLN
INTRAMUSCULAR | Status: AC
  Filled 2017-02-26: qty 1

## 2017-02-26 MED ORDER — HYDROMORPHONE HCL 1 MG/ML IJ SOLN
0.2500 mg | INTRAMUSCULAR | Status: DC | PRN
  Administered 2017-02-26 (×2): 0.25 mg via INTRAVENOUS
  Filled 2017-02-26: qty 0.5

## 2017-02-26 MED ORDER — TAMSULOSIN HCL 0.4 MG PO CAPS: 0.4000 mg | ORAL_CAPSULE | Freq: Every day | ORAL | 0 refills | Status: DC

## 2017-02-26 MED ORDER — PROPOFOL 10 MG/ML IV BOLUS
INTRAVENOUS | Status: AC
  Filled 2017-02-26: qty 20

## 2017-02-26 MED ORDER — PROPOFOL 10 MG/ML IV BOLUS
INTRAVENOUS | Status: DC | PRN
  Administered 2017-02-26: 150 mg via INTRAVENOUS
  Administered 2017-02-26: 50 mg via INTRAVENOUS

## 2017-02-26 MED ORDER — OXYCODONE HCL 5 MG/5ML PO SOLN
5.0000 mg | Freq: Once | ORAL | Status: AC | PRN
  Filled 2017-02-26: qty 5

## 2017-02-26 MED ORDER — SODIUM CHLORIDE 0.9 % IR SOLN
Status: DC | PRN
  Administered 2017-02-26 (×2): 3000 mL via INTRAVESICAL

## 2017-02-26 MED ORDER — FENTANYL CITRATE (PF) 100 MCG/2ML IJ SOLN
INTRAMUSCULAR | Status: DC | PRN
  Administered 2017-02-26 (×2): 50 ug via INTRAVENOUS

## 2017-02-26 MED ORDER — TAMSULOSIN HCL 0.4 MG PO CAPS
ORAL_CAPSULE | ORAL | Status: AC
  Filled 2017-02-26: qty 1

## 2017-02-26 MED ORDER — OXYCODONE HCL 5 MG PO TABS
ORAL_TABLET | ORAL | Status: AC
  Filled 2017-02-26: qty 1

## 2017-02-26 MED ORDER — LIDOCAINE 2% (20 MG/ML) 5 ML SYRINGE
INTRAMUSCULAR | Status: AC
  Filled 2017-02-26: qty 5

## 2017-02-26 MED ORDER — TAMSULOSIN HCL 0.4 MG PO CAPS
0.4000 mg | ORAL_CAPSULE | Freq: Every day | ORAL | Status: DC
  Administered 2017-02-26: 0.4 mg via ORAL
  Filled 2017-02-26: qty 1

## 2017-02-26 MED ORDER — FENTANYL CITRATE (PF) 100 MCG/2ML IJ SOLN
INTRAMUSCULAR | Status: AC
  Filled 2017-02-26: qty 2

## 2017-02-26 MED ORDER — LIDOCAINE 2% (20 MG/ML) 5 ML SYRINGE
INTRAMUSCULAR | Status: DC | PRN
  Administered 2017-02-26: 100 mg via INTRAVENOUS

## 2017-02-26 MED ORDER — KETOROLAC TROMETHAMINE 30 MG/ML IJ SOLN
INTRAMUSCULAR | Status: AC
  Filled 2017-02-26: qty 1

## 2017-02-26 MED ORDER — DEXAMETHASONE SODIUM PHOSPHATE 4 MG/ML IJ SOLN
INTRAMUSCULAR | Status: DC | PRN
  Administered 2017-02-26: 10 mg via INTRAVENOUS

## 2017-02-26 MED ORDER — PROMETHAZINE HCL 25 MG/ML IJ SOLN
6.2500 mg | INTRAMUSCULAR | Status: DC | PRN
  Filled 2017-02-26: qty 1

## 2017-02-26 MED ORDER — DEXAMETHASONE SODIUM PHOSPHATE 10 MG/ML IJ SOLN
INTRAMUSCULAR | Status: AC
  Filled 2017-02-26: qty 1

## 2017-02-26 MED ORDER — ONDANSETRON HCL 4 MG/2ML IJ SOLN
INTRAMUSCULAR | Status: AC
  Filled 2017-02-26: qty 2

## 2017-02-26 MED ORDER — ONDANSETRON HCL 4 MG/2ML IJ SOLN
INTRAMUSCULAR | Status: DC | PRN
  Administered 2017-02-26: 4 mg via INTRAVENOUS

## 2017-02-26 MED ORDER — KETOROLAC TROMETHAMINE 30 MG/ML IJ SOLN
INTRAMUSCULAR | Status: DC | PRN
  Administered 2017-02-26: 30 mg via INTRAVENOUS

## 2017-02-26 MED FILL — TAMSULOSIN HCL 0.4 MG CAP: 0.4 | 30 days supply | Qty: 30 | Fill #0

## 2017-02-26 MED FILL — OXYCOD/ACETAMINOPHEN 5-325M: 5-325 | 5 days supply | Qty: 30 | Fill #0

## 2017-02-26 SURGICAL SUPPLY — 29 items
BAG DRAIN URO-CYSTO SKYTR STRL (DRAIN) ×3 IMPLANT
CATH INTERMIT  6FR 70CM (CATHETERS) IMPLANT
CLOTH BEACON ORANGE TIMEOUT ST (SAFETY) ×3 IMPLANT
EVACUATOR MICROVAS BLADDER (UROLOGICAL SUPPLIES) IMPLANT
EXTRACTOR STONE 1.7FRX115CM (UROLOGICAL SUPPLIES) ×6 IMPLANT
FIBER LASER FLEXIVA 1000 (UROLOGICAL SUPPLIES) IMPLANT
FIBER LASER FLEXIVA 200 (UROLOGICAL SUPPLIES) ×3 IMPLANT
FIBER LASER FLEXIVA 365 (UROLOGICAL SUPPLIES) IMPLANT
FIBER LASER FLEXIVA 550 (UROLOGICAL SUPPLIES) IMPLANT
FIBER LASER TRAC TIP (UROLOGICAL SUPPLIES) IMPLANT
GLOVE BIO SURGEON STRL SZ8 (GLOVE) ×3 IMPLANT
GOWN STRL REUS W/TWL LRG LVL3 (GOWN DISPOSABLE) ×3 IMPLANT
GOWN STRL REUS W/TWL XL LVL3 (GOWN DISPOSABLE) ×3 IMPLANT
GUIDEWIRE ANG ZIPWIRE 038X150 (WIRE) ×3 IMPLANT
GUIDEWIRE STR DUAL SENSOR (WIRE) ×3 IMPLANT
INFUSOR MANOMETER BAG 3000ML (MISCELLANEOUS) ×3 IMPLANT
IV NS 1000ML (IV SOLUTION) ×2
IV NS 1000ML BAXH (IV SOLUTION) ×1 IMPLANT
IV NS IRRIG 3000ML ARTHROMATIC (IV SOLUTION) ×3 IMPLANT
KIT RM TURNOVER CYSTO AR (KITS) ×3 IMPLANT
MANIFOLD NEPTUNE II (INSTRUMENTS) ×3 IMPLANT
NS IRRIG 500ML POUR BTL (IV SOLUTION) ×3 IMPLANT
PACK CYSTO (CUSTOM PROCEDURE TRAY) ×3 IMPLANT
SHEATH URETERAL 12FRX35CM (MISCELLANEOUS) ×3 IMPLANT
STENT URET 6FRX26 CONTOUR (STENTS) ×3 IMPLANT
SYRINGE 10CC LL (SYRINGE) ×3 IMPLANT
TUBE CONNECTING 12'X1/4 (SUCTIONS)
TUBE CONNECTING 12X1/4 (SUCTIONS) IMPLANT
TUBE FEEDING 8FR 16IN STR KANG (MISCELLANEOUS) IMPLANT

## 2017-02-26 NOTE — H&P (Signed)
Urology Admission H&P  Chief Complaint: left renal calculi  History of Present Illness: Ms Carney Living is a 67yo with a hx of nephrolithiasis who has a worsenign stone burden on the left. No flank pain. No LUTS.  Past Medical History:  Diagnosis Date  . Arthritis   . Cyst of ovary   . GERD (gastroesophageal reflux disease)   . Hyperlipidemia   . Hypertension   . Liver cyst    per imaging 08 /2018  . Renal calculus, left   . Wears contact lenses    Past Surgical History:  Procedure Laterality Date  . EXCISION MASS, RECONSTRUCTION LEFT INDEX FINGER NAIL BED  10-11-2010    dr Fredna Dow  [facility]  . HAMMER TOE SURGERY Right 02/2016  . KNEE ARTHROSCOPY Left 1992  . TOTAL KNEE ARTHROPLASTY Left 04-14-2014    dr Lenna Sciara. poggi  [facility]  . VAGINAL HYSTERECTOMY  2000    Home Medications:  Current Facility-Administered Medications  Medication Dose Route Frequency Provider Last Rate Last Dose  . ceFAZolin (ANCEF) IVPB 2g/100 mL premix  2 g Intravenous 30 min Pre-Op Alyson Ingles Candee Furbish, MD      . lactated ringers infusion   Intravenous Continuous Annye Asa, MD 50 mL/hr at 02/26/17 1402    . sodium chloride irrigation 0.9 %    PRN Cleon Gustin, MD   3,000 mL at 02/26/17 1458   Allergies: No Known Allergies  Family History  Problem Relation Age of Onset  . Breast cancer Mother 74   Social History:  reports that  has never smoked. she has never used smokeless tobacco. She reports that she does not drink alcohol or use drugs.  Review of Systems  All other systems reviewed and are negative.   Physical Exam:  Vital signs in last 24 hours: Temp:  [97.6 F (36.4 C)] 97.6 F (36.4 C) (01/07 1308) Pulse Rate:  [84] 84 (01/07 1308) Resp:  [18] 18 (01/07 1308) BP: (140)/(80) 140/80 (01/07 1308) SpO2:  [98 %] 98 % (01/07 1308) Weight:  [90.7 kg (199 lb 14.4 oz)] 90.7 kg (199 lb 14.4 oz) (01/07 1308) Physical Exam  Constitutional: She is oriented to person, place, and time. She appears  well-developed and well-nourished.  HENT:  Head: Normocephalic and atraumatic.  Eyes: EOM are normal. Pupils are equal, round, and reactive to light.  Neck: Normal range of motion. No thyromegaly present.  Cardiovascular: Normal rate and regular rhythm.  Respiratory: Effort normal. No respiratory distress.  GI: Soft. She exhibits no distension.  Musculoskeletal: Normal range of motion. She exhibits no edema.  Neurological: She is alert and oriented to person, place, and time.  Skin: Skin is warm and dry.  Psychiatric: She has a normal mood and affect. Her behavior is normal. Judgment and thought content normal.    Laboratory Data:  Results for orders placed or performed during the hospital encounter of 02/26/17 (from the past 24 hour(s))  I-STAT 4, (NA,K, GLUC, HGB,HCT)     Status: None   Collection Time: 02/26/17  2:03 PM  Result Value Ref Range   Sodium 142 135 - 145 mmol/L   Potassium 4.2 3.5 - 5.1 mmol/L   Glucose, Bld 88 65 - 99 mg/dL   HCT 41.0 36.0 - 46.0 %   Hemoglobin 13.9 12.0 - 15.0 g/dL   No results found for this or any previous visit (from the past 240 hour(s)). Creatinine: No results for input(s): CREATININE in the last 168 hours. Baseline Creatinine: unknwon  Impression/Assessment:  67yo with left renal calculi  Plan:  The risks/benefits/alternatives to L URS was explained to the patient and she understands and wishes to proceed with surgery  Nicolette Bang 02/26/2017, 3:04 PM

## 2017-02-26 NOTE — Anesthesia Preprocedure Evaluation (Addendum)
Anesthesia Evaluation  Patient identified by MRN, date of birth, ID band Patient awake    Reviewed: Allergy & Precautions, NPO status , Patient's Chart, lab work & pertinent test results  Airway Mallampati: II  TM Distance: >3 FB Neck ROM: Full    Dental no notable dental hx.    Pulmonary neg pulmonary ROS,    Pulmonary exam normal breath sounds clear to auscultation       Cardiovascular hypertension, Pt. on medications Normal cardiovascular exam Rhythm:Regular Rate:Normal  ECG: NSR, rate 74   Neuro/Psych PSYCHIATRIC DISORDERS negative neurological ROS     GI/Hepatic Neg liver ROS, GERD  Medicated and Controlled,  Endo/Other  negative endocrine ROS  Renal/GU negative Renal ROS     Musculoskeletal   Abdominal (+) + obese,   Peds  Hematology HLD   Anesthesia Other Findings LEFT RENAL STONE  Reproductive/Obstetrics                            Anesthesia Physical Anesthesia Plan  ASA: III  Anesthesia Plan: General   Post-op Pain Management:    Induction: Intravenous  PONV Risk Score and Plan: 3 and Midazolam, Dexamethasone, Ondansetron and Treatment may vary due to age or medical condition  Airway Management Planned: LMA  Additional Equipment:   Intra-op Plan:   Post-operative Plan: Extubation in OR  Informed Consent: I have reviewed the patients History and Physical, chart, labs and discussed the procedure including the risks, benefits and alternatives for the proposed anesthesia with the patient or authorized representative who has indicated his/her understanding and acceptance.   Dental advisory given  Plan Discussed with: CRNA  Anesthesia Plan Comments:         Anesthesia Quick Evaluation

## 2017-02-26 NOTE — Anesthesia Procedure Notes (Addendum)
Procedure Name: LMA Insertion Date/Time: 02/26/2017 3:14 PM Performed by: Murvin Natal, MD Pre-anesthesia Checklist: Patient identified, Emergency Drugs available, Suction available and Patient being monitored Patient Re-evaluated:Patient Re-evaluated prior to induction Oxygen Delivery Method: Circle system utilized Preoxygenation: Pre-oxygenation with 100% oxygen Induction Type: IV induction Ventilation: Mask ventilation without difficulty LMA: LMA inserted LMA Size: 4.0 Number of attempts: 1 Airway Equipment and Method: Bite block Placement Confirmation: positive ETCO2 Tube secured with: Tape Dental Injury: Teeth and Oropharynx as per pre-operative assessment

## 2017-02-26 NOTE — Anesthesia Postprocedure Evaluation (Signed)
Anesthesia Post Note  Patient: NOLYN EILERT  Procedure(s) Performed: CYSTOSCOPY WITH RETROGRADE PYELOGRAM, URETEROSCOPY AND STENT PLACEMENT, STONE BASKET RETRIVAL (Left ) HOLMIUM LASER APPLICATION (Left )     Patient location during evaluation: PACU Anesthesia Type: General Level of consciousness: awake and alert Pain management: pain level controlled Vital Signs Assessment: post-procedure vital signs reviewed and stable Respiratory status: spontaneous breathing, nonlabored ventilation, respiratory function stable and patient connected to nasal cannula oxygen Cardiovascular status: blood pressure returned to baseline and stable Postop Assessment: no apparent nausea or vomiting Anesthetic complications: no    Last Vitals:  Vitals:   02/26/17 1730 02/26/17 1745  BP: 117/79 (!) 152/75  Pulse: 75 69  Resp: 17 18  Temp:    SpO2: 97% 94%    Last Pain:  Vitals:   02/26/17 1730  TempSrc:   PainSc: 8                  Ryan P Ellender

## 2017-02-26 NOTE — Op Note (Signed)
.  Preoperative diagnosis: Left lower pole stone  Postoperative diagnosis: Same  Procedure: 1 cystoscopy 2. Left retrograde pyelography 3.  Intraoperative fluoroscopy, under one hour, with interpretation 4.  Left ureteroscopic stone manipulation with laser lithotripsy 5.  Left 6 x 26 JJ stent placement  Attending: Rosie Fate  Anesthesia: General  Estimated blood loss: None  Drains: Left 6 x 26 JJ ureteral stent without tether  Specimens: stone for analysis  Antibiotics: ancef  Findings: left lower pole stone. No hydronephrosis. No masses/lesions in the bladder. Ureteral orifices in normal anatomic location.  Indications: Patient is a 66 year old female/female with a history of left renal stone and which has been growing in size.  After discussing treatment options, she decided proceed with left ureteroscopic stone manipulation.  Procedure her in detail: The patient was brought to the operating room and a brief timeout was done to ensure correct patient, correct procedure, correct site.  General anesthesia was administered patient was placed in dorsal lithotomy position.  Her genitalia was then prepped and draped in usual sterile fashion.  A rigid 60 French cystoscope was passed in the urethra and the bladder.  Bladder was inspected free masses or lesions.  the ureteral orifices were in the normal orthotopic locations.  a 6 french ureteral catheter was then instilled into the left ureteral orifice.  a gentle retrograde was obtained and findings noted above.  we then placed a zip wire through the ureteral catheter and advanced up to the renal pelvis.  we then removed the cystoscope and cannulated the left ureteral orifice with a semirigid ureteroscope.  No stone was found in the ureter. Once we reached the UPJ a sensor wire was advanced in to the renal pelvis. We then removed the ureteroscope and advanced am 12/14 x 35cm access sheath up to the renal pelvis. We then used the flexible  ureteroscope to perform nephroscopy. We encountered the stone in the lower pole.  Using a 200nm laser fiber the stone was fragmented and  the pieces were then removed with a Ngage basket.    once all stone fragments were removed we then removed the access sheath under direct vision and noted no injury to the ureter. We then placed a 6 x 26 double-j ureteral stent over the original zip wire.  We then removed the wire and good coil was noted in the the renal pelvis under fluoroscopy and the bladder under direct vision. the bladder was then drained and this concluded the procedure which was well tolerated by patient.  Complications: None  Condition: Stable, extubated, transferred to PACU  Plan: Patient is to be discharged home as to follow-up in one week for stent removal.

## 2017-02-26 NOTE — Transfer of Care (Signed)
  Last Vitals:  Vitals:   02/26/17 1308 02/26/17 1626  BP: 140/80   Pulse: 84   Resp: 18   Temp: 36.4 C 36.7 C  SpO2: 98%     Last Pain:  Vitals:   02/26/17 1331  TempSrc:   PainSc: 3       Patients Stated Pain Goal: 7 (02/26/17 1331)  Immediate Anesthesia Transfer of Care Note  Patient: SHAMARIA KAVAN  Procedure(s) Performed: Procedure(s) (LRB): CYSTOSCOPY WITH RETROGRADE PYELOGRAM, URETEROSCOPY AND STENT PLACEMENT, STONE BASKET RETRIVAL (Left) HOLMIUM LASER APPLICATION (Left)  Patient Location: PACU  Anesthesia Type: General  Level of Consciousness: awake, alert  and oriented  Airway & Oxygen Therapy: Patient Spontanous Breathing and Patient connected to nasal cannula oxygen  Post-op Assessment: Report given to PACU RN and Post -op Vital signs reviewed and stable  Post vital signs: Reviewed and stable  Complications: No apparent anesthesia complications

## 2017-02-26 NOTE — Discharge Instructions (Signed)
No advil, aleve, ibuprofen, naprosyn until 10 pm tonight    Ureteral Stent Implantation, Care After Refer to this sheet in the next few weeks. These instructions provide you with information about caring for yourself after your procedure. Your health care provider may also give you more specific instructions. Your treatment has been planned according to current medical practices, but problems sometimes occur. Call your health care provider if you have any problems or questions after your procedure. What can I expect after the procedure? After the procedure, it is common to have:  Nausea.  Mild pain when you urinate. You may feel this pain in your lower back or lower abdomen. Pain should stop within a few minutes after you urinate. This may last for up to 1 week.  A small amount of blood in your urine for several days.  Follow these instructions at home:  Medicines  Take over-the-counter and prescription medicines only as told by your health care provider.  If you were prescribed an antibiotic medicine, take it as told by your health care provider. Do not stop taking the antibiotic even if you start to feel better.  Do not drive for 24 hours if you received a sedative.  Do not drive or operate heavy machinery while taking prescription pain medicines. Activity  Return to your normal activities as told by your health care provider. Ask your health care provider what activities are safe for you.  Do not lift anything that is heavier than 10 lb (4.5 kg). Follow this limit for 1 week after your procedure, or for as long as told by your health care provider. General instructions  Watch for any blood in your urine. Call your health care provider if the amount of blood in your urine increases.  If you have a catheter: ? Follow instructions from your health care provider about taking care of your catheter and collection bag. ? Do not take baths, swim, or use a hot tub until your health  care provider approves.  Drink enough fluid to keep your urine clear or pale yellow.  Keep all follow-up visits as told by your health care provider. This is important. Contact a health care provider if:  You have pain that gets worse or does not get better with medicine, especially pain when you urinate.  You have difficulty urinating.  You feel nauseous or you vomit repeatedly during a period of more than 2 days after the procedure. Get help right away if:  Your urine is dark red or has blood clots in it.  You are leaking urine (have incontinence).  The end of the stent comes out of your urethra.  You cannot urinate.  You have sudden, sharp, or severe pain in your abdomen or lower back.  You have a fever. This information is not intended to replace advice given to you by your health care provider. Make sure you discuss any questions you have with your health care provider. Document Released: 10/09/2012 Document Revised: 07/15/2015 Document Reviewed: 08/21/2014 Elsevier Interactive Patient Education  2018 [facility] Care Instructions  Activity: Get plenty of rest for the remainder of the day. A responsible individual must stay with you for 24 hours following the procedure.  For the next 24 hours, DO NOT: -Drive a car -Paediatric nurse -Drink alcoholic beverages -Take any medication unless instructed by your physician -Make any legal decisions or sign important papers.  Meals: Start with liquid foods such as gelatin or soup. Progress  to regular foods as tolerated. Avoid greasy, spicy, heavy foods. If nausea and/or vomiting occur, drink only clear liquids until the nausea and/or vomiting subsides. Call your physician if vomiting continues.  Special Instructions/Symptoms: Your throat may feel dry or sore from the anesthesia or the breathing tube placed in your throat during surgery. If this causes discomfort, gargle with warm salt water. The  discomfort should disappear within 24 hours.  If you had a scopolamine patch placed behind your ear for the management of post- operative nausea and/or vomiting:  1. The medication in the patch is effective for 72 hours, after which it should be removed.  Wrap patch in a tissue and discard in the trash. Wash hands thoroughly with soap and water. 2. You may remove the patch earlier than 72 hours if you experience unpleasant side effects which may include dry mouth, dizziness or visual disturbances. 3. Avoid touching the patch. Wash your hands with soap and water after contact with the patch.

## 2017-02-27 ENCOUNTER — Encounter (HOSPITAL_BASED_OUTPATIENT_CLINIC_OR_DEPARTMENT_OTHER): Payer: Self-pay | Admitting: Urology

## 2017-11-01 ENCOUNTER — Ambulatory Visit
Admission: RE | Admit: 2017-11-01 | Discharge: 2017-11-01 | Disposition: A | Discharge status: Home / Self Care | Payer: Medicare Other | Source: Ambulatory Visit | Attending: Obstetrics and Gynecology | Admitting: Obstetrics and Gynecology

## 2017-11-01 ENCOUNTER — Other Ambulatory Visit: Payer: Self-pay | Admitting: Obstetrics and Gynecology

## 2017-11-01 DIAGNOSIS — R234 Changes in skin texture: Secondary | ICD-10-CM | POA: Diagnosis present

## 2017-11-01 DIAGNOSIS — N644 Mastodynia: Secondary | ICD-10-CM

## 2017-11-12 ENCOUNTER — Encounter
Admission: RE | Admit: 2017-11-12 | Discharge: 2017-11-12 | Disposition: A | Discharge status: Home / Self Care | Payer: Medicare Other | Source: Ambulatory Visit | Attending: Obstetrics and Gynecology | Admitting: Obstetrics and Gynecology

## 2017-11-12 ENCOUNTER — Other Ambulatory Visit: Payer: Self-pay

## 2017-11-12 DIAGNOSIS — Z01812 Encounter for preprocedural laboratory examination: Secondary | ICD-10-CM | POA: Insufficient documentation

## 2017-11-12 DIAGNOSIS — E7800 Pure hypercholesterolemia, unspecified: Secondary | ICD-10-CM | POA: Diagnosis not present

## 2017-11-12 DIAGNOSIS — Z01810 Encounter for preprocedural cardiovascular examination: Secondary | ICD-10-CM | POA: Diagnosis not present

## 2017-11-12 DIAGNOSIS — I10 Essential (primary) hypertension: Secondary | ICD-10-CM | POA: Insufficient documentation

## 2017-11-12 HISTORY — DX: Unspecified ovarian cyst, unspecified side: N83.209

## 2017-11-12 LAB — CBC
HCT: 42 % (ref 35.0–47.0)
Hemoglobin: 14.4 g/dL (ref 12.0–16.0)
MCH: 29.5 pg (ref 26.0–34.0)
MCHC: 34.2 g/dL (ref 32.0–36.0)
MCV: 86.1 fL (ref 80.0–100.0)
PLATELETS: 182 10*3/uL (ref 150–440)
RBC: 4.88 MIL/uL (ref 3.80–5.20)
RDW: 14.7 % — AB (ref 11.5–14.5)
WBC: 6.4 10*3/uL (ref 3.6–11.0)

## 2017-11-12 LAB — BASIC METABOLIC PANEL
ANION GAP: 6 (ref 5–15)
BUN: 14 mg/dL (ref 8–23)
CALCIUM: 9.4 mg/dL (ref 8.9–10.3)
CO2: 24 mmol/L (ref 22–32)
CREATININE: 0.67 mg/dL (ref 0.44–1.00)
Chloride: 111 mmol/L (ref 98–111)
GLUCOSE: 94 mg/dL (ref 70–99)
Potassium: 3.8 mmol/L (ref 3.5–5.1)
Sodium: 141 mmol/L (ref 135–145)

## 2017-11-12 NOTE — H&P (Signed)
Chief Complaint:     Lauren Carter is a 67 y.o. female here for scheduled surgery  HPI:  Pt presents for a preoperative visit to schedule a laproscopic bilateral oophorectomy for hx of an ovarian cyst with fhx of breast/ovarian cancer, no genetic counseling results available.  Pt with incidental finding of 4cm R ovarian cyst ultrasound found on CT urogram from kidney stones. Repeat CT confirmed this size.  An ultrasound in our office confirmed a 3.65cm complex right ovarian cyst, with a thin septation measuring 1.18mm. Otherwise no concerning features, no solid components or nodules. S/p hysterectomy, no free fluid, left ovary not visible.  Incidentally, there is a simple appearing 12cm mass favoring cyst on right hepatic lobe, which we did not visualize by ultrasound.  She is asx.  Ultrasound : hyst Lt ov not seen Rt ov complex cyst Increased in size = avg 3.91 cm:  4.74 x 2.98 x 4.00 cm  FHx of premenopausal ovarian cancer in sister in her 23s, and mother with breast cancer in her 44s. Both still living several decades later. No other hx of colon, stomach, uterine or breast or ovarian cancer.   Past Medical History:  has a past medical history of Arthritis, Cyst of ovary, GERD (gastroesophageal reflux disease), Hyperlipidemia, Hypertension, Liver cyst, Ovarian cyst, Renal calculus, left, and Wears contact lenses.  Past Surgical History:  has a past surgical history that includes Total knee arthroplasty (Left, 04-14-2014    dr Lenna Sciara. Roland Rack  [facility] [facility]); Knee arthroscopy (Left, 1992); EXCISION MASS, RECONSTRUCTION LEFT INDEX FINGER NAIL BED (10-11-2010    dr Fredna Dow  [facility]); Vaginal hysterectomy (2000); Hammer toe surgery (Right, 02/2016); Cystoscopy with retrograde pyelogram, ureteroscopy and stent placement (Left, 02/26/2017); and Holmium laser application (Left, 0/10/6043). Family History: family history includes Breast cancer (age of onset: 61) in her mother. Social History:  reports that she  has never smoked. She has never used smokeless tobacco. She reports that she drinks about 1.0 standard drinks of alcohol per week. She reports that she does not use drugs. OB/GYN History:  OB History   None     Allergies: is allergic to ibuprofen. Medications: No current facility-administered medications for this encounter.   Current Outpatient Medications:  .  B Complex-C (SUPER B COMPLEX PO), Take 1 capsule by mouth daily., Disp: , Rfl:  .  Calcium Carb-Cholecalciferol (CALCIUM 600 + D PO), Take 2 tablets by mouth daily., Disp: , Rfl:  .  cetirizine (ZYRTEC) 10 MG tablet, Take 10 mg by mouth daily., Disp: , Rfl:  .  Cholecalciferol (VITAMIN D) 2000 units tablet, Take 2,000 Units by mouth daily., Disp: , Rfl:  .  Glucosamine HCl-MSM (GLUCOSAMINE-MSM PO), Take 2 tablets by mouth daily. , Disp: , Rfl:  .  losartan (COZAAR) 100 MG tablet, Take 100 mg by mouth every evening., Disp: , Rfl:  .  LUTEIN-ZEAXANTHIN PO, Take 1 capsule by mouth daily., Disp: , Rfl:  .  Multiple Vitamins-Minerals (CENTRUM SILVER 50+WOMEN PO), Take 1 tablet by mouth daily., Disp: , Rfl:  .  omeprazole (PRILOSEC) 20 MG capsule, Take 20 mg by mouth daily. , Disp: , Rfl:  .  PARoxetine (PAXIL) 20 MG tablet, Take 20 mg by mouth every evening., Disp: , Rfl:  .  pravastatin (PRAVACHOL) 20 MG tablet, Take 20 mg by mouth every evening., Disp: , Rfl:  .  aspirin EC 81 MG tablet, Take 81 mg by mouth daily. , Disp: , Rfl:   Review of Systems: No SOB, no palpitations  or chest pain, no new lower extremity edema, no nausea or vomiting or bowel or bladder complaints. See HPI for gyn specific ROS.   Exam:   There were no vitals filed for this visit.  WDWN   female in NAD There is no height or weight on file to calculate BMI.  General: Patient is well-groomed, well-nourished, appears stated age in no acute distress   HEENT: head is atraumatic and normocephalic, trachea is midline, neck is supple with no palpable nodules    CV: Regular rhythm and normal heart rate, no murmur   Pulm: Clear to auscultation throughout lung fields with no wheezing, crackles, or rhonchi. No increased work of breathing  Abdomen: soft , no mass, non-tender, no rebound tenderness, no hepatomegaly  Pelvic: Deferred  Impression:   Ovarian cyst, complex, RIGHT ovary   Plan:   -  Preoperative visit for lap bilateral oophorectomy.   Consents signed today. Risks of surgery were discussed with the patient including but not limited to: bleeding which may require transfusion; infection which may require antibiotics; injury to uterus or surrounding organs; intrauterine scarring which may impair future fertility; need for additional procedures including laparotomy or laparoscopy; and other postoperative/anesthesia complications. Written informed consent was obtained.  This is a scheduled same-day surgery. She will have a postop visit in 2 weeks to review operative findings and pathology.   Benjaman Kindler, MD

## 2017-11-12 NOTE — Pre-Procedure Instructions (Signed)
Called office requesting preop orders.

## 2017-11-12 NOTE — Patient Instructions (Signed)
Your procedure is scheduled on: 11/16/17 Fri Report to Same Day Surgery 2nd floor medical mall [facility] Entrance-take elevator on left to 2nd floor.  Check in with surgery information desk.) To find out your arrival time please call [phone] between 1PM - 3PM on 11/15/17 Thur  Remember: Instructions that are not followed completely may result in serious medical risk, up to and including death, or upon the discretion of your surgeon and anesthesiologist your surgery may need to be rescheduled.    _x___ 1. Do not eat food after midnight the night before your procedure. You may drink clear liquids up to 2 hours before you are scheduled to arrive at the hospital for your procedure.  Do not drink clear liquids within 2 hours of your scheduled arrival to the hospital.  Clear liquids include  --Water or Apple juice without pulp  --Clear carbohydrate beverage such as ClearFast or Gatorade  --Black Coffee or Clear Tea (No milk, no creamers, do not add anything to                  the coffee or Tea Type 1 and type 2 diabetics should only drink water.   ____Ensure clear carbohydrate drink on the way to the hospital for bariatric patients  ____Ensure clear carbohydrate drink 3 hours before surgery for Dr Dwyane Luo patients if physician instructed.   No gum chewing or hard candies.     __x__ 2. No Alcohol for 24 hours before or after surgery.   __x__3. No Smoking or e-cigarettes for 24 prior to surgery.  Do not use any chewable tobacco products for at least 6 hour prior to surgery   ____  4. Bring all medications with you on the day of surgery if instructed.    __x__ 5. Notify your doctor if there is any change in your medical condition     (cold, fever, infections).    x___6. On the morning of surgery brush your teeth with toothpaste and water.  You may rinse your mouth with mouth wash if you wish.  Do not swallow any toothpaste or mouthwash.   Do not wear jewelry,  make-up, hairpins, clips or nail polish.  Do not wear lotions, powders, or perfumes. You may wear deodorant.  Do not shave 48 hours prior to surgery. Men may shave face and neck.  Do not bring valuables to the hospital.    [facility] is not responsible for any belongings or valuables.               Contacts, dentures or bridgework may not be worn into surgery.  Leave your suitcase in the car. After surgery it may be brought to your room.  For patients admitted to the hospital, discharge time is determined by your                       treatment team.  _  Patients discharged the day of surgery will not be allowed to drive home.  You will need someone to drive you home and stay with you the night of your procedure.    Please read over the following fact sheets that you were given:   [facility] Preparing for Surgery and or MRSA Information   _x___ Take anti-hypertensive listed below, cardiac, seizure, asthma,     anti-reflux and psychiatric medicines. These include:  1. cetirizine (ZYRTEC) 10 MG tablet  2.omeprazole (PRILOSEC) 20 MG capsule  3.  4.  5.  6.  ____Fleets enema or Magnesium Citrate as directed.   _x___ Use CHG Soap or sage wipes as directed on instruction sheet   ____ Use inhalers on the day of surgery and bring to hospital day of surgery  ____ Stop Metformin and Janumet 2 days prior to surgery.    ____ Take 1/2 of usual insulin dose the night before surgery and none on the morning     surgery.   _x___ Follow recommendations from Cardiologist, Pulmonologist or PCP regarding          stopping Aspirin, Coumadin, Plavix ,Eliquis, Effient, or Pradaxa, and Pletal.  X____Stop Anti-inflammatories such as Advil, Aleve, Ibuprofen, Motrin, Naproxen, Naprosyn, Goodies powders or aspirin products. OK to take Tylenol and                          Celebrex.   _x___ Stop supplements until after surgery.  But may continue Vitamin D, Vitamin B,       and multivitamin.   ____ Bring  C-Pap to the hospital.

## 2017-11-16 ENCOUNTER — Ambulatory Visit: Payer: Medicare Other | Admitting: Certified Registered Nurse Anesthetist

## 2017-11-16 ENCOUNTER — Encounter
Admission: RE | Disposition: A | Discharge status: Home / Self Care | Payer: Self-pay | Source: Ambulatory Visit | Attending: Obstetrics and Gynecology

## 2017-11-16 ENCOUNTER — Ambulatory Visit
Admission: RE | Admit: 2017-11-16 | Discharge: 2017-11-16 | Disposition: A | Discharge status: Home / Self Care | Payer: Medicare Other | Source: Ambulatory Visit | Attending: Obstetrics and Gynecology | Admitting: Obstetrics and Gynecology

## 2017-11-16 ENCOUNTER — Other Ambulatory Visit: Payer: Self-pay

## 2017-11-16 DIAGNOSIS — Z96652 Presence of left artificial knee joint: Secondary | ICD-10-CM | POA: Diagnosis not present

## 2017-11-16 DIAGNOSIS — E785 Hyperlipidemia, unspecified: Secondary | ICD-10-CM | POA: Insufficient documentation

## 2017-11-16 DIAGNOSIS — N736 Female pelvic peritoneal adhesions (postinfective): Secondary | ICD-10-CM | POA: Diagnosis not present

## 2017-11-16 DIAGNOSIS — N83291 Other ovarian cyst, right side: Secondary | ICD-10-CM | POA: Diagnosis not present

## 2017-11-16 DIAGNOSIS — N838 Other noninflammatory disorders of ovary, fallopian tube and broad ligament: Secondary | ICD-10-CM | POA: Insufficient documentation

## 2017-11-16 DIAGNOSIS — Z8041 Family history of malignant neoplasm of ovary: Secondary | ICD-10-CM | POA: Insufficient documentation

## 2017-11-16 DIAGNOSIS — K219 Gastro-esophageal reflux disease without esophagitis: Secondary | ICD-10-CM | POA: Insufficient documentation

## 2017-11-16 DIAGNOSIS — D1809 Hemangioma of other sites: Secondary | ICD-10-CM | POA: Diagnosis not present

## 2017-11-16 DIAGNOSIS — Z803 Family history of malignant neoplasm of breast: Secondary | ICD-10-CM | POA: Insufficient documentation

## 2017-11-16 DIAGNOSIS — I10 Essential (primary) hypertension: Secondary | ICD-10-CM | POA: Diagnosis not present

## 2017-11-16 DIAGNOSIS — N83292 Other ovarian cyst, left side: Secondary | ICD-10-CM | POA: Insufficient documentation

## 2017-11-16 DIAGNOSIS — Z7982 Long term (current) use of aspirin: Secondary | ICD-10-CM | POA: Insufficient documentation

## 2017-11-16 DIAGNOSIS — Z79899 Other long term (current) drug therapy: Secondary | ICD-10-CM | POA: Insufficient documentation

## 2017-11-16 HISTORY — PX: LAPAROSCOPIC LYSIS OF ADHESIONS: SHX5905

## 2017-11-16 HISTORY — PX: LAPAROSCOPIC BILATERAL SALPINGO OOPHERECTOMY: SHX5890

## 2017-11-16 HISTORY — PX: CYSTOSCOPY: SHX5120

## 2017-11-16 LAB — TYPE AND SCREEN
ABO/RH(D): O POS
ANTIBODY SCREEN: NEGATIVE

## 2017-11-16 LAB — ABO/RH: ABO/RH(D): O POS

## 2017-11-16 SURGERY — SALPINGO-OOPHORECTOMY, BILATERAL, LAPAROSCOPIC
Anesthesia: General

## 2017-11-16 MED ORDER — ACETAMINOPHEN 500 MG PO TABS: 1000.0000 mg | ORAL_TABLET | Freq: Four times a day (QID) | ORAL | 0 refills | Status: AC

## 2017-11-16 MED ORDER — BUPIVACAINE HCL 0.5 % IJ SOLN
INTRAMUSCULAR | Status: DC | PRN
  Administered 2017-11-16: 10 mL

## 2017-11-16 MED ORDER — FENTANYL CITRATE (PF) 100 MCG/2ML IJ SOLN
INTRAMUSCULAR | Status: AC
  Administered 2017-11-16: 25 ug via INTRAVENOUS
  Filled 2017-11-16: qty 2

## 2017-11-16 MED ORDER — OXYCODONE HCL 5 MG PO TABS: 5.0000 mg | ORAL_TABLET | Freq: Once | ORAL | Status: DC | PRN

## 2017-11-16 MED ORDER — THROMBIN 5000 UNITS EX SOLR
CUTANEOUS | Status: AC
  Filled 2017-11-16: qty 5000

## 2017-11-16 MED ORDER — DEXAMETHASONE SODIUM PHOSPHATE 10 MG/ML IJ SOLN
INTRAMUSCULAR | Status: DC | PRN
  Administered 2017-11-16: 10 mg via INTRAVENOUS

## 2017-11-16 MED ORDER — DOCUSATE SODIUM 100 MG PO CAPS: 100.0000 mg | ORAL_CAPSULE | Freq: Two times a day (BID) | ORAL | 0 refills | Status: DC

## 2017-11-16 MED ORDER — LACTATED RINGERS IV SOLN
INTRAVENOUS | Status: DC
  Administered 2017-11-16 (×2): via INTRAVENOUS

## 2017-11-16 MED ORDER — GABAPENTIN 800 MG PO TABS: 800.0000 mg | ORAL_TABLET | Freq: Every day | ORAL | 0 refills | Status: DC

## 2017-11-16 MED ORDER — EPHEDRINE SULFATE 50 MG/ML IJ SOLN
INTRAMUSCULAR | Status: AC
  Filled 2017-11-16: qty 1

## 2017-11-16 MED ORDER — MIDAZOLAM HCL 2 MG/2ML IJ SOLN
INTRAMUSCULAR | Status: AC
  Filled 2017-11-16: qty 2

## 2017-11-16 MED ORDER — DEXAMETHASONE SODIUM PHOSPHATE 10 MG/ML IJ SOLN
INTRAMUSCULAR | Status: AC
  Filled 2017-11-16: qty 1

## 2017-11-16 MED ORDER — LIDOCAINE HCL (CARDIAC) PF 100 MG/5ML IV SOSY
PREFILLED_SYRINGE | INTRAVENOUS | Status: DC | PRN
  Administered 2017-11-16: 100 mg via INTRAVENOUS

## 2017-11-16 MED ORDER — PROPOFOL 10 MG/ML IV BOLUS
INTRAVENOUS | Status: AC
  Filled 2017-11-16: qty 20

## 2017-11-16 MED ORDER — SUGAMMADEX SODIUM 200 MG/2ML IV SOLN
INTRAVENOUS | Status: AC
  Filled 2017-11-16: qty 2

## 2017-11-16 MED ORDER — ACETAMINOPHEN NICU IV SYRINGE 10 MG/ML
INTRAVENOUS | Status: AC
  Filled 2017-11-16: qty 1

## 2017-11-16 MED ORDER — SODIUM CHLORIDE 0.9 % IV SOLN
INTRAVENOUS | Status: DC | PRN
  Administered 2017-11-16: 25 ug/min via INTRAVENOUS

## 2017-11-16 MED ORDER — PROPOFOL 10 MG/ML IV BOLUS
INTRAVENOUS | Status: DC | PRN
  Administered 2017-11-16: 180 mg via INTRAVENOUS

## 2017-11-16 MED ORDER — FAMOTIDINE 20 MG PO TABS
20.0000 mg | ORAL_TABLET | Freq: Once | ORAL | Status: AC
  Administered 2017-11-16: 20 mg via ORAL

## 2017-11-16 MED ORDER — PROMETHAZINE HCL 25 MG/ML IJ SOLN: 6.2500 mg | INTRAMUSCULAR | Status: DC | PRN

## 2017-11-16 MED ORDER — BUPIVACAINE HCL (PF) 0.5 % IJ SOLN
INTRAMUSCULAR | Status: AC
  Filled 2017-11-16: qty 30

## 2017-11-16 MED ORDER — FENTANYL CITRATE (PF) 100 MCG/2ML IJ SOLN
25.0000 ug | INTRAMUSCULAR | Status: DC | PRN
  Administered 2017-11-16 (×2): 25 ug via INTRAVENOUS

## 2017-11-16 MED ORDER — ONDANSETRON HCL 4 MG/2ML IJ SOLN
INTRAMUSCULAR | Status: AC
  Filled 2017-11-16: qty 2

## 2017-11-16 MED ORDER — ROCURONIUM BROMIDE 100 MG/10ML IV SOLN
INTRAVENOUS | Status: DC | PRN
  Administered 2017-11-16: 50 mg via INTRAVENOUS

## 2017-11-16 MED ORDER — FAMOTIDINE 20 MG PO TABS
ORAL_TABLET | ORAL | Status: AC
  Administered 2017-11-16: 20 mg via ORAL
  Filled 2017-11-16: qty 1

## 2017-11-16 MED ORDER — PHENYLEPHRINE HCL 10 MG/ML IJ SOLN
INTRAMUSCULAR | Status: DC | PRN
  Administered 2017-11-16 (×6): 100 ug via INTRAVENOUS

## 2017-11-16 MED ORDER — KETOROLAC TROMETHAMINE 30 MG/ML IJ SOLN
INTRAMUSCULAR | Status: DC | PRN
  Administered 2017-11-16: 30 mg via INTRAVENOUS

## 2017-11-16 MED ORDER — SUGAMMADEX SODIUM 200 MG/2ML IV SOLN
INTRAVENOUS | Status: DC | PRN
  Administered 2017-11-16: 200 mg via INTRAVENOUS

## 2017-11-16 MED ORDER — FENTANYL CITRATE (PF) 100 MCG/2ML IJ SOLN
INTRAMUSCULAR | Status: DC | PRN
  Administered 2017-11-16 (×4): 50 ug via INTRAVENOUS

## 2017-11-16 MED ORDER — PHENYLEPHRINE HCL 10 MG/ML IJ SOLN
INTRAMUSCULAR | Status: AC
  Filled 2017-11-16: qty 1

## 2017-11-16 MED ORDER — FENTANYL CITRATE (PF) 100 MCG/2ML IJ SOLN
INTRAMUSCULAR | Status: AC
  Filled 2017-11-16: qty 2

## 2017-11-16 MED ORDER — ROCURONIUM BROMIDE 50 MG/5ML IV SOLN
INTRAVENOUS | Status: AC
  Filled 2017-11-16: qty 1

## 2017-11-16 MED ORDER — THROMBIN 5000 UNITS EX SOLR
CUTANEOUS | Status: DC | PRN
  Administered 2017-11-16: 5000 [IU] via TOPICAL

## 2017-11-16 MED ORDER — KETOROLAC TROMETHAMINE 30 MG/ML IJ SOLN
INTRAMUSCULAR | Status: AC
  Filled 2017-11-16: qty 1

## 2017-11-16 MED ORDER — OXYCODONE HCL 5 MG/5ML PO SOLN: 5.0000 mg | Freq: Once | ORAL | Status: DC | PRN

## 2017-11-16 MED ORDER — MIDAZOLAM HCL 2 MG/2ML IJ SOLN
INTRAMUSCULAR | Status: DC | PRN
  Administered 2017-11-16: 2 mg via INTRAVENOUS

## 2017-11-16 MED ORDER — OXYCODONE HCL 5 MG PO CAPS: 5.0000 mg | ORAL_CAPSULE | Freq: Four times a day (QID) | ORAL | 0 refills | Status: DC | PRN

## 2017-11-16 MED ORDER — ACETAMINOPHEN 10 MG/ML IV SOLN
INTRAVENOUS | Status: DC | PRN
  Administered 2017-11-16: 1000 mg via INTRAVENOUS

## 2017-11-16 MED ORDER — EPHEDRINE SULFATE 50 MG/ML IJ SOLN
INTRAMUSCULAR | Status: DC | PRN
  Administered 2017-11-16: 10 mg via INTRAVENOUS
  Administered 2017-11-16: 20 mg via INTRAVENOUS

## 2017-11-16 MED ORDER — LIDOCAINE HCL (PF) 2 % IJ SOLN
INTRAMUSCULAR | Status: AC
  Filled 2017-11-16: qty 10

## 2017-11-16 MED ORDER — OXYCODONE HCL 5 MG PO TABS
5.0000 mg | ORAL_TABLET | Freq: Four times a day (QID) | ORAL | Status: DC | PRN
  Administered 2017-11-16: 5 mg via ORAL
  Filled 2017-11-16: qty 1

## 2017-11-16 MED ORDER — LACTATED RINGERS IV SOLN
INTRAVENOUS | Status: DC
  Administered 2017-11-16: 08:00:00 via INTRAVENOUS

## 2017-11-16 MED ORDER — OXYCODONE HCL 5 MG PO TABS
ORAL_TABLET | ORAL | Status: AC
  Filled 2017-11-16: qty 1

## 2017-11-16 MED ORDER — ONDANSETRON HCL 4 MG/2ML IJ SOLN
INTRAMUSCULAR | Status: DC | PRN
  Administered 2017-11-16: 4 mg via INTRAVENOUS

## 2017-11-16 SURGICAL SUPPLY — 65 items
ANCHOR TIS RET SYS 235ML (MISCELLANEOUS) ×4 IMPLANT
APPLICATOR SURGIFLO ENDO (HEMOSTASIS) ×4 IMPLANT
BAG URINE DRAINAGE (UROLOGICAL SUPPLIES) ×4 IMPLANT
BLADE SURG SZ11 CARB STEEL (BLADE) ×4 IMPLANT
CANISTER SUCT 1200ML W/VALVE (MISCELLANEOUS) ×4 IMPLANT
CATH FOLEY 2WAY  5CC 16FR (CATHETERS) ×2
CATH URTH 16FR FL 2W BLN LF (CATHETERS) ×2 IMPLANT
CHLORAPREP W/TINT 26ML (MISCELLANEOUS) ×4 IMPLANT
CLOSURE WOUND 1/4X4 (GAUZE/BANDAGES/DRESSINGS) ×1
DERMABOND ADVANCED (GAUZE/BANDAGES/DRESSINGS) ×2
DERMABOND ADVANCED .7 DNX12 (GAUZE/BANDAGES/DRESSINGS) ×2 IMPLANT
DRAPE LAP W/FLUID (DRAPES) ×4 IMPLANT
DRAPE UNDER BUTTOCK W/FLU (DRAPES) ×4 IMPLANT
DRSG TELFA 3X8 NADH (GAUZE/BANDAGES/DRESSINGS) ×4 IMPLANT
ELECT CAUTERY BLADE 6.4 (BLADE) ×4 IMPLANT
ELECT REM PT RETURN 9FT ADLT (ELECTROSURGICAL) ×4
ELECTRODE REM PT RTRN 9FT ADLT (ELECTROSURGICAL) ×2 IMPLANT
GAUZE SPONGE 4X4 12PLY STRL (GAUZE/BANDAGES/DRESSINGS) ×4 IMPLANT
GLOVE BIO SURGEON STRL SZ7 (GLOVE) ×8 IMPLANT
GLOVE INDICATOR 7.5 STRL GRN (GLOVE) ×4 IMPLANT
GOWN STRL REUS W/ TWL LRG LVL3 (GOWN DISPOSABLE) ×4 IMPLANT
GOWN STRL REUS W/ TWL XL LVL3 (GOWN DISPOSABLE) ×2 IMPLANT
GOWN STRL REUS W/TWL LRG LVL3 (GOWN DISPOSABLE) ×4
GOWN STRL REUS W/TWL XL LVL3 (GOWN DISPOSABLE) ×2
IRRIGATION STRYKERFLOW (MISCELLANEOUS) ×2 IMPLANT
IRRIGATOR STRYKERFLOW (MISCELLANEOUS) ×4
IV NS 1000ML (IV SOLUTION) ×2
IV NS 1000ML BAXH (IV SOLUTION) ×2 IMPLANT
KIT PINK PAD W/HEAD ARE REST (MISCELLANEOUS) ×4
KIT PINK PAD W/HEAD ARM REST (MISCELLANEOUS) ×2 IMPLANT
KIT TURNOVER CYSTO (KITS) ×4 IMPLANT
LABEL OR SOLS (LABEL) ×4 IMPLANT
LIGASURE VESSEL 5MM BLUNT TIP (ELECTROSURGICAL) ×4 IMPLANT
NS IRRIG 500ML POUR BTL (IV SOLUTION) ×4 IMPLANT
PACK BASIN MAJOR ARMC (MISCELLANEOUS) ×4 IMPLANT
PACK GYN LAPAROSCOPIC (MISCELLANEOUS) ×4 IMPLANT
PAD OB MATERNITY 4.3X12.25 (PERSONAL CARE ITEMS) ×4 IMPLANT
PAD PREP 24X41 OB/GYN DISP (PERSONAL CARE ITEMS) ×4 IMPLANT
POUCH SPECIMEN RETRIEVAL 10MM (ENDOMECHANICALS) ×4 IMPLANT
SCISSORS METZENBAUM CVD 33 (INSTRUMENTS) IMPLANT
SET CYSTO W/LG BORE CLAMP LF (SET/KITS/TRAYS/PACK) ×4 IMPLANT
SLEEVE ENDOPATH XCEL 5M (ENDOMECHANICALS) ×4 IMPLANT
SOL PREP PVP 2OZ (MISCELLANEOUS) ×4
SOLUTION PREP PVP 2OZ (MISCELLANEOUS) ×2 IMPLANT
SPOGE SURGIFLO 8M (HEMOSTASIS) ×2
SPONGE SURGIFLO 8M (HEMOSTASIS) ×2 IMPLANT
SPONGE XRAY 4X4 16PLY STRL (MISCELLANEOUS) ×4 IMPLANT
STAPLER SKIN PROX 35W (STAPLE) ×4 IMPLANT
STRIP CLOSURE SKIN 1/4X4 (GAUZE/BANDAGES/DRESSINGS) ×3 IMPLANT
SURGILUBE 2OZ TUBE FLIPTOP (MISCELLANEOUS) ×4 IMPLANT
SUT MNCRL AB 4-0 PS2 18 (SUTURE) ×4 IMPLANT
SUT VIC AB 0 CT1 27 (SUTURE) ×6
SUT VIC AB 0 CT1 27XCR 8 STRN (SUTURE) ×6 IMPLANT
SUT VIC AB 0 CT1 36 (SUTURE) ×8 IMPLANT
SUT VIC AB 2-0 SH 27 (SUTURE) ×8
SUT VIC AB 2-0 SH 27XBRD (SUTURE) ×8 IMPLANT
SUT VIC AB 2-0 UR6 27 (SUTURE) ×4 IMPLANT
SUT VIC AB 4-0 SH 27 (SUTURE) ×2
SUT VIC AB 4-0 SH 27XANBCTRL (SUTURE) ×2 IMPLANT
SUT VICRYL PLUS ABS 0 54 (SUTURE) ×4 IMPLANT
SYR BULB IRRIG 60ML STRL (SYRINGE) ×4 IMPLANT
TRAY FOLEY MTR SLVR 16FR STAT (SET/KITS/TRAYS/PACK) ×4 IMPLANT
TROCAR XCEL NON-BLD 5MMX100MML (ENDOMECHANICALS) ×4 IMPLANT
TUBING INSUFFLATION (TUBING) ×4 IMPLANT
WATER STERILE IRR 1000ML POUR (IV SOLUTION) ×4 IMPLANT

## 2017-11-16 NOTE — Anesthesia Preprocedure Evaluation (Addendum)
Anesthesia Evaluation  Patient identified by MRN, date of birth, ID band Patient awake    Reviewed: Allergy & Precautions, H&P , NPO status , Patient's Chart, lab work & pertinent test results  Airway Mallampati: II  TM Distance: >3 FB Neck ROM: full    Dental  (+) Teeth Intact   Pulmonary neg pulmonary ROS, neg COPD,    breath sounds clear to auscultation       Cardiovascular hypertension, (-) angina(-) Past MI and (-) Cardiac Stents negative cardio ROS  (-) dysrhythmias  Rhythm:regular Rate:Normal     Neuro/Psych negative neurological ROS  negative psych ROS   GI/Hepatic negative GI ROS, Neg liver ROS, GERD  ,  Endo/Other  negative endocrine ROS  Renal/GU Renal disease (stone)     Musculoskeletal  (+) Arthritis ,   Abdominal   Peds  Hematology negative hematology ROS (+)   Anesthesia Other Findings Past Medical History: No date: Arthritis No date: Cyst of ovary No date: GERD (gastroesophageal reflux disease) No date: Hyperlipidemia No date: Hypertension No date: Liver cyst     Comment:  per imaging 08 /2018 No date: Ovarian cyst     Comment:  Right No date: Renal calculus, left No date: Wears contact lenses  Past Surgical History: 02/26/2017: CYSTOSCOPY WITH RETROGRADE PYELOGRAM, URETEROSCOPY AND  STENT PLACEMENT; Left     Comment:  Procedure: CYSTOSCOPY WITH RETROGRADE PYELOGRAM,               URETEROSCOPY AND STENT PLACEMENT, STONE BASKET RETRIVAL;               Surgeon: Cleon Gustin, MD;  Location: [facility];  Service: Urology;  Laterality: Left; 10-11-2010    dr Fredna Dow  [facility]: EXCISION MASS, RECONSTRUCTION LEFT  INDEX FINGER NAIL BED 02/2016: HAMMER TOE SURGERY; Right 02/26/2017: HOLMIUM LASER APPLICATION; Left     Comment:  Procedure: HOLMIUM LASER APPLICATION;  Surgeon:               Cleon Gustin, MD;  Location: [facility];   Service: Urology;  Laterality: Left; 1992: KNEE ARTHROSCOPY; Left 04-14-2014    dr j. poggi  [facility]: TOTAL KNEE ARTHROPLASTY; Left 2000: VAGINAL HYSTERECTOMY     Reproductive/Obstetrics negative OB ROS                            Anesthesia Physical Anesthesia Plan  ASA: II  Anesthesia Plan: General ETT   Post-op Pain Management:    Induction:   PONV Risk Score and Plan: Ondansetron and Dexamethasone  Airway Management Planned:   Additional Equipment:   Intra-op Plan:   Post-operative Plan:   Informed Consent: I have reviewed the patients History and Physical, chart, labs and discussed the procedure including the risks, benefits and alternatives for the proposed anesthesia with the patient or authorized representative who has indicated his/her understanding and acceptance.   Dental Advisory Given  Plan Discussed with: Anesthesiologist, CRNA and Surgeon  Anesthesia Plan Comments:        Anesthesia Quick Evaluation

## 2017-11-16 NOTE — Transfer of Care (Signed)
Immediate Anesthesia Transfer of Care Note  Patient: Lauren Carter  Procedure(s) Performed: LAPAROSCOPIC BILATERAL SALPINGO OOPHORECTOMY (Bilateral ) CYSTOSCOPY (N/A ) LAPAROSCOPIC LYSIS OF ADHESIONS (N/A )  Patient Location: PACU  Anesthesia Type:General  Level of Consciousness: awake and sedated  Airway & Oxygen Therapy: Patient Spontanous Breathing and Patient connected to face mask oxygen  Post-op Assessment: Report given to RN and Post -op Vital signs reviewed and stable  Post vital signs: Reviewed and stable  Last Vitals:  Vitals Value Taken Time  BP    Temp    Pulse    Resp 13 11/16/2017 11:26 AM  SpO2    Vitals shown include unvalidated device data.  Last Pain:  Vitals:   11/16/17 0732  TempSrc: Tympanic         Complications: No apparent anesthesia complications

## 2017-11-16 NOTE — Op Note (Addendum)
Lauren Carter PROCEDURE DATE: 11/16/2017  PREOPERATIVE DIAGNOSIS: Persistent complex right ovarian cyst in postmenopausal patient; family hx of ovarian cancer  POSTOPERATIVE DIAGNOSIS:  - Pelvic adhesions, right ovarian cyst, encased adherent left ovary.  PROCEDURE:  - Exam under anesthesia - Laparoscopic lysis of adhesions, comprising more than 50% of the time of this case. - Bilateral salping-oophorectomy - Cystoscopy  SURGEON:  Dr. Benjaman Kindler ASSISTANT: CST ANESTHESIOLOGIST: Durenda Hurt, MD Anesthesiologist: Durenda Hurt, MD CRNA: Eben Burow, CRNA  INDICATIONS: 67 y.o. persistent complex right ovarian cyst with a strong family hx of breast/ovarian cancer but no genetic testing results available desiring surgical evaluation.   Please see preoperative notes for further details.  Risks of surgery were discussed with the patient including but not limited to: bleeding which may require transfusion or reoperation; infection which may require antibiotics; injury to bowel, bladder, ureters or other surrounding organs; need for additional procedures including laparotomy; thromboembolic phenomenon, incisional problems and other postoperative/anesthesia complications. Written informed consent was obtained.    FINDINGS:  Absent uterus, encased left ovary behind the left sigmoid and a large right ovary adherent to the bladder and pelvic side wall. Both ovaries appeared to have segments of fallopian tubes bilaterally.   No other abdominal/pelvic abnormality.  Normal upper abdomen, and no cysts found on liver  ANESTHESIA:    General INTRAVENOUS FLUIDS: 1100 ml ESTIMATED BLOOD LOSS: 10 ml URINE OUTPUT: 75 ml SPECIMENS: Left and right ovary and tube COMPLICATIONS: None immediate  PROCEDURE IN DETAIL:  The patient had sequential compression devices applied to her lower extremities while in the preoperative area.  She was then taken to the operating room where general  anesthesia was administered and was found to be adequate.  She was placed in the dorsal lithotomy position, and was prepped and draped in a sterile manner.  A Foley catheter was inserted into her bladder and attached to constant drainage and a uterine manipulator was then advanced into the uterus .  After an adequate timeout was performed, attention was turned to the abdomen where an umbilical incision was made with the scalpel.  The Optiview 5-mm trocar and sleeve were then advanced into an adhesion. This was removed, and a 67mm port placed at palmer's point in the RUQ without difficulty with the laparoscope under direct visualization into the abdomen.  The abdomen was then insufflated with carbon dioxide gas and adequate pneumoperitoneum was obtained. The adhesion at the umbilicus was omental only and this was not removed.  A detailed survey of the patient's pelvis and abdomen revealed the findings as mentioned above.  An additional 11mm trocar was placed in the left lower quadrant under direct visualization.  Evaluation of the pelvic sidewalls to observe the course of the ureters was undertaken, assuring the ureter was outside the operative field. However, the full course of the left ureter was not visible under the adhesions, and cystoscopy was planned.  Oopherectomy The left adnexa was evaluated, and the scar tissue carefully taken down using bipolar cautery with sharp dissection.  The left tube and ovary were mobilized away from the left pelvic sidewall, the course of the ureter seen in part. Using a Ligasure electrocautery device, the IP ligament was skeletonized, triply clamped, cauterized and transected to remove the ovary and fallopian tube in its entirety, being careful not to spill the contents of the ovarian cyst.  Attention was turned to the right ureter, which was seen coursing away from the IP.  The right ovary was  adherent to the right pelvic sidewall anteriorly and to the bladder.  The  bladder was backfilled, and ovary carefully dissected away from the sidewall.  It was similarly removed and a triple clamp cautery cut maneuver, and both the ovaries were then placed into a 10 Endo Catch bag.  The operative site was surveyed, and it was found to be hemostatic.  On the left, there was some concern for using, but close enough to the sigmoid that cautery was not advisable.  FloSeal was placed over this area, the intraperitoneal pressure dropped to 7, and good hemostasis was noted throughout.  No intraoperative injury to surrounding organs was noted. The fascia of the 10 mm trocar site was closed with 0 Vicryl in 3 figure-of-eight's, to close the full length of the fascia.  Pictures were taken of the quadrants and pelvis. The abdomen was desufflated and all instruments were then removed from the patient's abdomen. The uterine manipulator was removed without complications.  All incisions were closed with 4-0 Vicryl and Dermabond.   CYSTOCOPY PARAGRAPH The Foley catheter was temporarily removed and an uncomplicated cystoscopy was performed. The bladder mucosa was visualized with the above findings. Excellent efflux was noted from both ureteral orifices.  The patient tolerated the procedures well.  All instruments, needles, and sponge counts were correct x 2. The patient was taken to the recovery room in stable condition.

## 2017-11-16 NOTE — Discharge Instructions (Addendum)
Laparoscopic Ovarian Surgery Discharge Instructions  For the next three days, take ibuprofen and acetaminophen on a schedule, every 8 hours. You can take them together or you can intersperse them, and take one every four hours. I also gave you gabapentin for nighttime, to help you sleep and also to control pain. Take gabapentin medicines at night for at least the next 3 nights. You also have a narcotic, oxycodone, to take as needed if the above medicines don't help.  Postop constipation is a major cause of pain. Stay well hydrated, walk as you tolerate, and take over the counter senna as well as stool softeners if you need them.   RISKS AND COMPLICATIONS   Infection.  Bleeding.  Injury to surrounding organs.  Anesthetic side effects.   PROCEDURE   You may be given a medicine to help you relax (sedative) before the procedure. You will be given a medicine to make you sleep (general anesthetic) during the procedure.  A tube will be put down your throat to help your breath while under general anesthesia.  Several small cuts (incisions) are made in the lower abdominal area and one incision is made near the belly button.  Your abdominal area will be inflated with a safe gas (carbon dioxide). This helps give the surgeon room to operate, visualize, and helps the surgeon avoid other organs.  A thin, lighted tube (laparoscope) with a camera attached is inserted into your abdomen through the incision near the belly button. Other small instruments may also be inserted through other abdominal incisions.  The ovary is located and are removed.  After the ovary is removed, the gas is released from the abdomen.  The incisions will be closed with stitches (sutures), and Dermabond. A bandage may be placed over the incisions.  AFTER THE PROCEDURE   You will also have some mild abdominal discomfort for 3-7 days. You will be given pain medicine to ease any discomfort.  As long as there are no  problems, you may be allowed to go home. Someone will need to drive you home and be with you for at least 24 hours once home.  You may have some mild discomfort in the throat. This is from the tube placed in your throat while you were sleeping.  You may experience discomfort in the shoulder area from some trapped air between the liver and diaphragm. This sensation is normal and will slowly go away on its own.  HOME CARE INSTRUCTIONS   Take all medicines as directed.  Only take over-the-counter or prescription medicines for pain, discomfort, or fever as directed by your caregiver.  Resume daily activities as directed.  Showers are preferred over baths for 2 weeks.  You may resume sexual activities in 1 week or as you feel you would like to.  Do not drive while taking narcotics.  SEEK MEDICAL CARE IF: .  There is increasing abdominal pain.  You feel lightheaded or faint.  You have the chills.  You have an oral temperature above 102 F (38.9 C).  There is pus-like (purulent) drainage from any of the wounds.  You are unable to pass gas or have a bowel movement.  You feel sick to your stomach (nauseous) or throw up (vomit) and can't control it with your medicines.  MAKE SURE YOU:   Understand these instructions.  Will watch your condition.  Will get help right away if you are not doing well or get worse.  ExitCare Patient Information 2013 [employer]     AMBULATORY SURGERY  DISCHARGE INSTRUCTIONS   1) The drugs that you were given will stay in your system until tomorrow so for the next 24 hours you should not:  A) Drive an automobile B) Make any legal decisions C) Drink any alcoholic beverage   2) You may resume regular meals tomorrow.  Today it is better to start with liquids and gradually work up to solid foods.  You may eat anything you prefer, but it is better to start with liquids, then soup and crackers, and gradually work up to solid  foods.   3) Please notify your doctor immediately if you have any unusual bleeding, trouble breathing, redness and pain at the surgery site, drainage, fever, or pain not relieved by medication.    4) Additional Instructions:        Please contact your physician with any problems or Same Day Surgery at [phone], Monday through Friday 6 am to 4 pm, or [facility] Main number at [phone].   

## 2017-11-16 NOTE — Anesthesia Post-op Follow-up Note (Signed)
Anesthesia QCDR form completed.        

## 2017-11-16 NOTE — Interval H&P Note (Signed)
History and Physical Interval Note:  11/16/2017 8:33 AM  Lauren Carter  has presented today for surgery, with the diagnosis of right ovarian cyst  The various methods of treatment have been discussed with the patient and family. After consideration of risks, benefits and other options for treatment, the patient has consented to  Procedure(s): LAPAROSCOPIC BILATERAL SALPINGO OOPHORECTOMY (Bilateral) OOPHORECTOMY (N/A) as a surgical intervention .  The patient's history has been reviewed, patient examined, no change in status, stable for surgery.  I have reviewed the patient's chart and labs.  Questions were answered to the patient's satisfaction.     Benjaman Kindler

## 2017-11-16 NOTE — Anesthesia Procedure Notes (Signed)
Procedure Name: Intubation Date/Time: 11/16/2017 9:25 AM Performed by: Eben Burow, CRNA Pre-anesthesia Checklist: Patient identified, Emergency Drugs available, Suction available, Patient being monitored and Timeout performed Patient Re-evaluated:Patient Re-evaluated prior to induction Oxygen Delivery Method: Circle system utilized Preoxygenation: Pre-oxygenation with 100% oxygen Induction Type: IV induction Ventilation: Mask ventilation without difficulty Laryngoscope Size: Miller and 2 Grade View: Grade I Tube type: Oral Tube size: 7.0 mm Number of attempts: 1 Airway Equipment and Method: Stylet and LTA kit utilized Placement Confirmation: ETT inserted through vocal cords under direct vision,  positive ETCO2 and breath sounds checked- equal and bilateral Secured at: 21 cm Tube secured with: Tape Dental Injury: Teeth and Oropharynx as per pre-operative assessment

## 2017-11-20 LAB — SURGICAL PATHOLOGY

## 2017-11-20 NOTE — Anesthesia Postprocedure Evaluation (Signed)
Anesthesia Post Note  Patient: Lauren Carter  Procedure(s) Performed: LAPAROSCOPIC BILATERAL SALPINGO OOPHORECTOMY (Bilateral ) CYSTOSCOPY (N/A ) LAPAROSCOPIC LYSIS OF ADHESIONS (N/A )  Patient location during evaluation: PACU Anesthesia Type: General Level of consciousness: awake and alert Pain management: pain level controlled Vital Signs Assessment: post-procedure vital signs reviewed and stable Respiratory status: spontaneous breathing, nonlabored ventilation and respiratory function stable Cardiovascular status: blood pressure returned to baseline and stable Postop Assessment: no apparent nausea or vomiting Anesthetic complications: no     Last Vitals:  Vitals:   11/16/17 1237 11/16/17 1322  BP: (!) 108/59 (!) 104/59  Pulse: 85 81  Resp: 14 14  Temp: (!) 36.4 C   SpO2: 96% 96%    Last Pain:  Vitals:   11/16/17 1322  TempSrc:   PainSc: [place]

## 2017-11-22 ENCOUNTER — Other Ambulatory Visit: Payer: Self-pay | Admitting: Obstetrics and Gynecology

## 2017-11-22 ENCOUNTER — Ambulatory Visit
Admission: RE | Admit: 2017-11-22 | Discharge: 2017-11-22 | Disposition: A | Discharge status: Home / Self Care | Payer: Medicare Other | Source: Ambulatory Visit | Attending: Obstetrics and Gynecology | Admitting: Obstetrics and Gynecology

## 2017-11-22 DIAGNOSIS — G8918 Other acute postprocedural pain: Secondary | ICD-10-CM | POA: Insufficient documentation

## 2017-11-22 DIAGNOSIS — D7389 Other diseases of spleen: Secondary | ICD-10-CM | POA: Insufficient documentation

## 2017-11-22 DIAGNOSIS — D3501 Benign neoplasm of right adrenal gland: Secondary | ICD-10-CM | POA: Insufficient documentation

## 2017-11-22 DIAGNOSIS — K7689 Other specified diseases of liver: Secondary | ICD-10-CM | POA: Insufficient documentation

## 2017-11-22 MED ORDER — IOPAMIDOL (ISOVUE-300) INJECTION 61%
100.0000 mL | Freq: Once | INTRAVENOUS | Status: AC | PRN
  Administered 2017-11-22: 100 mL via INTRAVENOUS

## 2019-01-03 ENCOUNTER — Other Ambulatory Visit: Payer: Self-pay | Admitting: Family Medicine

## 2019-01-03 DIAGNOSIS — Z1231 Encounter for screening mammogram for malignant neoplasm of breast: Secondary | ICD-10-CM

## 2019-01-27 ENCOUNTER — Other Ambulatory Visit: Payer: Self-pay | Admitting: Orthopedic Surgery

## 2019-01-27 DIAGNOSIS — M259 Joint disorder, unspecified: Secondary | ICD-10-CM

## 2019-02-03 ENCOUNTER — Other Ambulatory Visit: Payer: Self-pay

## 2019-02-03 ENCOUNTER — Ambulatory Visit
Admission: RE | Admit: 2019-02-03 | Discharge: 2019-02-03 | Disposition: A | Discharge status: Home / Self Care | Payer: Medicare Other | Source: Ambulatory Visit | Attending: Orthopedic Surgery | Admitting: Orthopedic Surgery

## 2019-02-03 DIAGNOSIS — M259 Joint disorder, unspecified: Secondary | ICD-10-CM

## 2019-04-14 ENCOUNTER — Ambulatory Visit
Admission: RE | Admit: 2019-04-14 | Discharge: 2019-04-14 | Disposition: A | Discharge status: Home / Self Care | Payer: Medicare PPO | Source: Ambulatory Visit | Attending: Family Medicine | Admitting: Family Medicine

## 2019-04-14 DIAGNOSIS — Z1231 Encounter for screening mammogram for malignant neoplasm of breast: Secondary | ICD-10-CM | POA: Diagnosis present

## 2020-03-15 ENCOUNTER — Other Ambulatory Visit: Payer: Self-pay | Admitting: Family Medicine

## 2020-03-15 DIAGNOSIS — Z1231 Encounter for screening mammogram for malignant neoplasm of breast: Secondary | ICD-10-CM

## 2020-03-26 ENCOUNTER — Other Ambulatory Visit: Payer: Self-pay | Admitting: Neurological Surgery

## 2020-03-26 DIAGNOSIS — M4310 Spondylolisthesis, site unspecified: Secondary | ICD-10-CM

## 2020-04-14 ENCOUNTER — Ambulatory Visit
Admission: RE | Admit: 2020-04-14 | Discharge: 2020-04-14 | Disposition: A | Discharge status: Home / Self Care | Payer: Medicare PPO | Source: Ambulatory Visit | Attending: Family Medicine | Admitting: Family Medicine

## 2020-04-14 ENCOUNTER — Other Ambulatory Visit: Payer: Self-pay

## 2020-04-14 DIAGNOSIS — Z1231 Encounter for screening mammogram for malignant neoplasm of breast: Secondary | ICD-10-CM | POA: Diagnosis present

## 2020-04-16 NOTE — Progress Notes (Signed)
Surgical Instructions    Your procedure is scheduled on 04/21/20.  Report to [facility] Main Entrance "A" at 08:30 A.M., then check in with the Admitting office.  Call this number if you have problems the morning of surgery:  [phone]   If you have any questions prior to your surgery date call [phone]: Open Monday-Friday 8am-4pm    Remember:  Do not eat or drink after midnight the night before your surgery     Take these medicines the morning of surgery with A SIP OF WATER  cetirizine (ZYRTEC) omeprazole (PRILOSEC)   As of today, STOP taking any Aspirin (unless otherwise instructed by your surgeon) Aleve, Naproxen, Ibuprofen, Motrin, Advil, Goody's, BC's, all herbal medications, fish oil, and all vitamins.                     Do not wear jewelry, make up, or nail polish            Do not wear lotions, powders, perfumes/colognes, or deodorant.            Do not shave 48 hours prior to surgery.              Do not bring valuables to the hospital.            [facility] is not responsible for any belongings or valuables.  Do NOT Smoke (Tobacco/Vaping) or drink Alcohol 24 hours prior to your procedure If you use a CPAP at night, you may bring all equipment for your overnight stay.   Contacts, glasses, dentures or bridgework may not be worn into surgery, please bring cases for these belongings   For patients admitted to the hospital, discharge time will be determined by your treatment team.   Patients discharged the day of surgery will not be allowed to drive home, and someone needs to stay with them for 24 hours.    Special instructions:   [place]- Preparing For Surgery  Before surgery, you can play an important role. Because skin is not sterile, your skin needs to be as free of germs as possible. You can reduce the number of germs on your skin by washing with CHG (chlorahexidine gluconate) Soap before surgery.  CHG is an antiseptic cleaner which kills germs and bonds  with the skin to continue killing germs even after washing.    Oral Hygiene is also important to reduce your risk of infection.  Remember - BRUSH YOUR TEETH THE MORNING OF SURGERY WITH YOUR REGULAR TOOTHPASTE  Please do not use if you have an allergy to CHG or antibacterial soaps. If your skin becomes reddened/irritated stop using the CHG.  Do not shave (including legs and underarms) for at least 48 hours prior to first CHG shower. It is OK to shave your face.  Please follow these instructions carefully.   1. Shower the NIGHT BEFORE SURGERY and the MORNING OF SURGERY  2. If you chose to wash your hair, wash your hair first as usual with your normal shampoo.  3. After you shampoo, rinse your hair and body thoroughly to remove the shampoo.  4. Wash Face and genitals (private parts) with your normal soap.   5.  Shower the NIGHT BEFORE SURGERY and the MORNING OF SURGERY with CHG Soap.   6. Use CHG Soap as you would any other liquid soap. You can apply CHG directly to the skin and wash gently with a scrungie or a clean washcloth.   7. Apply the  CHG Soap to your body ONLY FROM THE NECK DOWN.  Do not use on open wounds or open sores. Avoid contact with your eyes, ears, mouth and genitals (private parts). Wash Face and genitals (private parts)  with your normal soap.   8. Wash thoroughly, paying special attention to the area where your surgery will be performed.  9. Thoroughly rinse your body with warm water from the neck down.  10. DO NOT shower/wash with your normal soap after using and rinsing off the CHG Soap.  11. Pat yourself dry with a CLEAN TOWEL.  12. Wear CLEAN PAJAMAS to bed the night before surgery  13. Place CLEAN SHEETS on your bed the night before your surgery  14. DO NOT SLEEP WITH PETS.   Day of Surgery: Take a shower.  Wear Clean/Comfortable clothing the morning of surgery Do not apply any deodorants/lotions.   Remember to brush your teeth WITH YOUR REGULAR  TOOTHPASTE.   Please read over the following fact sheets that you were given.

## 2020-04-19 ENCOUNTER — Other Ambulatory Visit (HOSPITAL_COMMUNITY)
Admission: RE | Admit: 2020-04-19 | Discharge: 2020-04-19 | Disposition: A | Discharge status: Home / Self Care | Payer: Medicare PPO | Source: Ambulatory Visit | Attending: Neurological Surgery | Admitting: Neurological Surgery

## 2020-04-19 ENCOUNTER — Ambulatory Visit (HOSPITAL_COMMUNITY)
Admission: RE | Admit: 2020-04-19 | Discharge: 2020-04-19 | Disposition: A | Discharge status: Home / Self Care | Payer: Medicare PPO | Source: Ambulatory Visit | Attending: Neurological Surgery | Admitting: Neurological Surgery

## 2020-04-19 ENCOUNTER — Encounter (HOSPITAL_COMMUNITY): Payer: Self-pay

## 2020-04-19 ENCOUNTER — Encounter (HOSPITAL_COMMUNITY)
Admission: RE | Admit: 2020-04-19 | Discharge: 2020-04-19 | Disposition: A | Discharge status: Home / Self Care | Payer: Medicare PPO | Source: Ambulatory Visit | Attending: Neurological Surgery | Admitting: Neurological Surgery

## 2020-04-19 ENCOUNTER — Other Ambulatory Visit: Payer: Self-pay

## 2020-04-19 DIAGNOSIS — M4310 Spondylolisthesis, site unspecified: Secondary | ICD-10-CM | POA: Insufficient documentation

## 2020-04-19 DIAGNOSIS — Z01812 Encounter for preprocedural laboratory examination: Secondary | ICD-10-CM | POA: Insufficient documentation

## 2020-04-19 DIAGNOSIS — Z20822 Contact with and (suspected) exposure to covid-19: Secondary | ICD-10-CM | POA: Insufficient documentation

## 2020-04-19 HISTORY — DX: Personal history of urinary calculi: Z87.442

## 2020-04-19 LAB — SURGICAL PCR SCREEN
MRSA, PCR: NEGATIVE
Staphylococcus aureus: NEGATIVE

## 2020-04-19 LAB — PROTIME-INR
INR: 0.9 (ref 0.8–1.2)
Prothrombin Time: 12 seconds (ref 11.4–15.2)

## 2020-04-19 LAB — BASIC METABOLIC PANEL
Anion gap: 11 (ref 5–15)
BUN: 12 mg/dL (ref 8–23)
CO2: 26 mmol/L (ref 22–32)
Calcium: 10 mg/dL (ref 8.9–10.3)
Chloride: 105 mmol/L (ref 98–111)
Creatinine, Ser: 0.74 mg/dL (ref 0.44–1.00)
GFR, Estimated: >60 mL/min (ref >60)
Glucose, Bld: 96 mg/dL (ref 70–99)
Potassium: 3.3 mmol/L — ABNORMAL LOW (ref 3.5–5.1)
Sodium: 142 mmol/L (ref 135–145)

## 2020-04-19 LAB — CBC WITH DIFFERENTIAL/PLATELET
Abs Immature Granulocytes: 0.02 10*3/uL (ref 0.00–0.07)
Basophils Absolute: 0.1 10*3/uL (ref 0.0–0.1)
Basophils Relative: 1 %
Eosinophils Absolute: 0.2 10*3/uL (ref 0.0–0.5)
Eosinophils Relative: 4 %
HCT: 45.2 % (ref 36.0–46.0)
Hemoglobin: 15 g/dL (ref 12.0–15.0)
Immature Granulocytes: 0 %
Lymphocytes Relative: 18 %
Lymphs Abs: 1.2 10*3/uL (ref 0.7–4.0)
MCH: 29.3 pg (ref 26.0–34.0)
MCHC: 33.2 g/dL (ref 30.0–36.0)
MCV: 88.3 fL (ref 80.0–100.0)
Monocytes Absolute: 0.5 10*3/uL (ref 0.1–1.0)
Monocytes Relative: 7 %
Neutro Abs: 4.7 10*3/uL (ref 1.7–7.7)
Neutrophils Relative %: 70 %
Platelets: 223 10*3/uL (ref 150–400)
RBC: 5.12 MIL/uL — ABNORMAL HIGH (ref 3.87–5.11)
RDW: 13.8 % (ref 11.5–15.5)
WBC: 6.6 10*3/uL (ref 4.0–10.5)
nRBC: 0 % (ref 0.0–0.2)

## 2020-04-19 LAB — TYPE AND SCREEN
ABO/RH(D): O POS
Antibody Screen: NEGATIVE

## 2020-04-19 LAB — SARS CORONAVIRUS 2 (TAT 6-24 HRS): SARS Coronavirus 2: NEGATIVE

## 2020-04-19 NOTE — Progress Notes (Signed)
PCP - Dr. Barbaraann Cao Cardiologist - denies  Chest x-ray - 04/19/20 EKG - 04/19/20 Stress Test - denies ECHO - 15+ years ago. No f/u needed. Cardiac Cath - denies  Sleep Study - denies CPAP - denies  Blood Thinner Instructions: n/a Aspirin Instructions: n/a  ERAS Protcol -n/a PRE-SURGERY Ensure or G2- n/a  COVID TEST- 04/19/20  Anesthesia review: No  Patient denies shortness of breath, fever, cough and chest pain at PAT appointment   All instructions explained to the patient, with a verbal understanding of the material. Patient agrees to go over the instructions while at home for a better understanding. Patient also instructed to self quarantine after being tested for COVID-19. The opportunity to ask questions was provided.

## 2020-04-20 HISTORY — PX: SPINAL FUSION: SHX223

## 2020-04-21 ENCOUNTER — Other Ambulatory Visit: Payer: Self-pay

## 2020-04-21 ENCOUNTER — Inpatient Hospital Stay (HOSPITAL_COMMUNITY): Payer: Medicare PPO | Admitting: Physician Assistant

## 2020-04-21 ENCOUNTER — Inpatient Hospital Stay (HOSPITAL_COMMUNITY): Payer: Medicare PPO

## 2020-04-21 ENCOUNTER — Inpatient Hospital Stay (HOSPITAL_COMMUNITY): Payer: Medicare PPO | Admitting: Anesthesiology

## 2020-04-21 ENCOUNTER — Encounter (HOSPITAL_COMMUNITY): Payer: Self-pay | Admitting: Neurological Surgery

## 2020-04-21 ENCOUNTER — Inpatient Hospital Stay (HOSPITAL_COMMUNITY)
Admission: RE | Admit: 2020-04-21 | Discharge: 2020-04-24 | DRG: 455 | Disposition: A | Discharge status: Home / Self Care | Payer: Medicare PPO | Attending: Neurological Surgery | Admitting: Neurological Surgery

## 2020-04-21 ENCOUNTER — Inpatient Hospital Stay (HOSPITAL_COMMUNITY)
Admission: RE | Disposition: A | Discharge status: Home / Self Care | Payer: Self-pay | Source: Home / Self Care | Attending: Neurological Surgery

## 2020-04-21 DIAGNOSIS — Z9841 Cataract extraction status, right eye: Secondary | ICD-10-CM | POA: Diagnosis not present

## 2020-04-21 DIAGNOSIS — I10 Essential (primary) hypertension: Secondary | ICD-10-CM | POA: Diagnosis present

## 2020-04-21 DIAGNOSIS — Z20822 Contact with and (suspected) exposure to covid-19: Secondary | ICD-10-CM | POA: Diagnosis present

## 2020-04-21 DIAGNOSIS — M48061 Spinal stenosis, lumbar region without neurogenic claudication: Principal | ICD-10-CM | POA: Diagnosis present

## 2020-04-21 DIAGNOSIS — Z9842 Cataract extraction status, left eye: Secondary | ICD-10-CM

## 2020-04-21 DIAGNOSIS — Z803 Family history of malignant neoplasm of breast: Secondary | ICD-10-CM | POA: Diagnosis not present

## 2020-04-21 DIAGNOSIS — M4316 Spondylolisthesis, lumbar region: Secondary | ICD-10-CM | POA: Diagnosis present

## 2020-04-21 DIAGNOSIS — K219 Gastro-esophageal reflux disease without esophagitis: Secondary | ICD-10-CM | POA: Diagnosis present

## 2020-04-21 DIAGNOSIS — Z79899 Other long term (current) drug therapy: Secondary | ICD-10-CM | POA: Diagnosis not present

## 2020-04-21 DIAGNOSIS — Z96652 Presence of left artificial knee joint: Secondary | ICD-10-CM | POA: Diagnosis present

## 2020-04-21 DIAGNOSIS — Z981 Arthrodesis status: Secondary | ICD-10-CM

## 2020-04-21 DIAGNOSIS — Z961 Presence of intraocular lens: Secondary | ICD-10-CM | POA: Diagnosis present

## 2020-04-21 DIAGNOSIS — Z419 Encounter for procedure for purposes other than remedying health state, unspecified: Secondary | ICD-10-CM

## 2020-04-21 DIAGNOSIS — M5416 Radiculopathy, lumbar region: Secondary | ICD-10-CM | POA: Diagnosis present

## 2020-04-21 DIAGNOSIS — Z90710 Acquired absence of both cervix and uterus: Secondary | ICD-10-CM

## 2020-04-21 DIAGNOSIS — Z7982 Long term (current) use of aspirin: Secondary | ICD-10-CM

## 2020-04-21 DIAGNOSIS — E785 Hyperlipidemia, unspecified: Secondary | ICD-10-CM | POA: Diagnosis present

## 2020-04-21 DIAGNOSIS — Z87442 Personal history of urinary calculi: Secondary | ICD-10-CM

## 2020-04-21 DIAGNOSIS — Z886 Allergy status to analgesic agent status: Secondary | ICD-10-CM

## 2020-04-21 SURGERY — POSTERIOR LUMBAR FUSION 3 LEVEL
Anesthesia: General | Site: Back

## 2020-04-21 MED ORDER — LACTATED RINGERS IV SOLN: INTRAVENOUS | Status: DC

## 2020-04-21 MED ORDER — ACETAMINOPHEN 500 MG PO TABS
1000.0000 mg | ORAL_TABLET | ORAL | Status: AC
  Administered 2020-04-21: 1000 mg via ORAL
  Filled 2020-04-21: qty 2

## 2020-04-21 MED ORDER — ORAL CARE MOUTH RINSE: 15.0000 mL | Freq: Once | OROMUCOSAL | Status: AC

## 2020-04-21 MED ORDER — ONDANSETRON HCL 4 MG/2ML IJ SOLN: 4.0000 mg | Freq: Once | INTRAMUSCULAR | Status: DC | PRN

## 2020-04-21 MED ORDER — OXYCODONE HCL 5 MG PO TABS
ORAL_TABLET | ORAL | Status: AC
  Filled 2020-04-21: qty 1

## 2020-04-21 MED ORDER — ACETAMINOPHEN 325 MG PO TABS
650.0000 mg | ORAL_TABLET | ORAL | Status: DC | PRN
  Administered 2020-04-21 – 2020-04-22 (×2): 650 mg via ORAL
  Filled 2020-04-21 (×2): qty 2

## 2020-04-21 MED ORDER — DEXAMETHASONE 4 MG PO TABS
4.0000 mg | ORAL_TABLET | Freq: Four times a day (QID) | ORAL | Status: DC
  Administered 2020-04-22 – 2020-04-23 (×4): 4 mg via ORAL
  Filled 2020-04-21 (×4): qty 1

## 2020-04-21 MED ORDER — MIDAZOLAM HCL 2 MG/2ML IJ SOLN
INTRAMUSCULAR | Status: DC | PRN
  Administered 2020-04-21: 2 mg via INTRAVENOUS

## 2020-04-21 MED ORDER — ACETAMINOPHEN 650 MG RE SUPP: 650.0000 mg | RECTAL | Status: DC | PRN

## 2020-04-21 MED ORDER — SODIUM CHLORIDE 0.9 % IV SOLN: 250.0000 mL | INTRAVENOUS | Status: DC

## 2020-04-21 MED ORDER — 0.9 % SODIUM CHLORIDE (POUR BTL) OPTIME
TOPICAL | Status: DC | PRN
  Administered 2020-04-21 (×3): 1000 mL

## 2020-04-21 MED ORDER — BUPIVACAINE HCL (PF) 0.25 % IJ SOLN
INTRAMUSCULAR | Status: AC
  Filled 2020-04-21: qty 30

## 2020-04-21 MED ORDER — LUTEIN-ZEAXANTHIN 15-0.7 MG PO CAPS: ORAL_CAPSULE | Freq: Every day | ORAL | Status: DC

## 2020-04-21 MED ORDER — HYDROMORPHONE HCL 1 MG/ML IJ SOLN
0.5000 mg | INTRAMUSCULAR | Status: DC | PRN
  Administered 2020-04-22: 0.5 mg via INTRAVENOUS
  Filled 2020-04-21: qty 0.5

## 2020-04-21 MED ORDER — DEXAMETHASONE SODIUM PHOSPHATE 10 MG/ML IJ SOLN
INTRAMUSCULAR | Status: AC
  Filled 2020-04-21: qty 1

## 2020-04-21 MED ORDER — FENTANYL CITRATE (PF) 100 MCG/2ML IJ SOLN
INTRAMUSCULAR | Status: DC | PRN
  Administered 2020-04-21 (×5): 50 ug via INTRAVENOUS

## 2020-04-21 MED ORDER — CEFAZOLIN SODIUM-DEXTROSE 2-4 GM/100ML-% IV SOLN
2.0000 g | INTRAVENOUS | Status: AC
  Administered 2020-04-21 (×2): 2 g via INTRAVENOUS
  Filled 2020-04-21: qty 100

## 2020-04-21 MED ORDER — LIDOCAINE 2% (20 MG/ML) 5 ML SYRINGE
INTRAMUSCULAR | Status: AC
  Filled 2020-04-21: qty 5

## 2020-04-21 MED ORDER — FENTANYL CITRATE (PF) 100 MCG/2ML IJ SOLN
25.0000 ug | INTRAMUSCULAR | Status: DC | PRN
  Administered 2020-04-21 (×2): 25 ug via INTRAVENOUS

## 2020-04-21 MED ORDER — PHENYLEPHRINE 40 MCG/ML (10ML) SYRINGE FOR IV PUSH (FOR BLOOD PRESSURE SUPPORT)
PREFILLED_SYRINGE | INTRAVENOUS | Status: DC | PRN
  Administered 2020-04-21: 125 ug via INTRAVENOUS

## 2020-04-21 MED ORDER — SUGAMMADEX SODIUM 200 MG/2ML IV SOLN
INTRAVENOUS | Status: DC | PRN
  Administered 2020-04-21: 200 mg via INTRAVENOUS

## 2020-04-21 MED ORDER — ONDANSETRON HCL 4 MG PO TABS: 4.0000 mg | ORAL_TABLET | Freq: Four times a day (QID) | ORAL | Status: DC | PRN

## 2020-04-21 MED ORDER — POTASSIUM CHLORIDE IN NACL 20-0.9 MEQ/L-% IV SOLN: INTRAVENOUS | Status: DC

## 2020-04-21 MED ORDER — VANCOMYCIN HCL 1000 MG IV SOLR
INTRAVENOUS | Status: AC
  Filled 2020-04-21: qty 1000

## 2020-04-21 MED ORDER — PAROXETINE HCL 20 MG PO TABS: 20.0000 mg | ORAL_TABLET | Freq: Every evening | ORAL | Status: DC

## 2020-04-21 MED ORDER — THROMBIN 20000 UNITS EX SOLR
CUTANEOUS | Status: AC
  Filled 2020-04-21: qty 20000

## 2020-04-21 MED ORDER — FENTANYL CITRATE (PF) 250 MCG/5ML IJ SOLN
INTRAMUSCULAR | Status: AC
  Filled 2020-04-21: qty 5

## 2020-04-21 MED ORDER — OXYCODONE HCL 5 MG PO TABS
10.0000 mg | ORAL_TABLET | ORAL | Status: DC | PRN
  Administered 2020-04-21 – 2020-04-22 (×6): 10 mg via ORAL
  Filled 2020-04-21 (×6): qty 2

## 2020-04-21 MED ORDER — CEFAZOLIN SODIUM 1 G IJ SOLR
INTRAMUSCULAR | Status: AC
  Filled 2020-04-21: qty 20

## 2020-04-21 MED ORDER — CHLORHEXIDINE GLUCONATE CLOTH 2 % EX PADS: 6.0000 | MEDICATED_PAD | Freq: Once | CUTANEOUS | Status: DC

## 2020-04-21 MED ORDER — VANCOMYCIN HCL 1000 MG IV SOLR
INTRAVENOUS | Status: DC | PRN
  Administered 2020-04-21: 1000 mg via TOPICAL

## 2020-04-21 MED ORDER — SODIUM CHLORIDE 0.9% FLUSH: 3.0000 mL | INTRAVENOUS | Status: DC | PRN

## 2020-04-21 MED ORDER — BUPIVACAINE HCL (PF) 0.25 % IJ SOLN
INTRAMUSCULAR | Status: DC | PRN
  Administered 2020-04-21: 10 mL
  Administered 2020-04-21: 5 mL

## 2020-04-21 MED ORDER — THROMBIN 5000 UNITS EX SOLR
OROMUCOSAL | Status: DC | PRN
  Administered 2020-04-21 (×2): 5 mL via TOPICAL

## 2020-04-21 MED ORDER — OXYCODONE HCL 5 MG PO TABS
5.0000 mg | ORAL_TABLET | Freq: Once | ORAL | Status: DC | PRN
Start: 2020-04-21 — End: 2020-04-21

## 2020-04-21 MED ORDER — PROPOFOL 10 MG/ML IV BOLUS
INTRAVENOUS | Status: DC | PRN
Start: 2020-04-21 — End: 2020-04-21
  Administered 2020-04-21: 140 mg via INTRAVENOUS

## 2020-04-21 MED ORDER — METHOCARBAMOL 500 MG PO TABS
ORAL_TABLET | ORAL | Status: AC
  Administered 2020-04-21: 500 mg via ORAL
  Filled 2020-04-21: qty 1

## 2020-04-21 MED ORDER — CHLORHEXIDINE GLUCONATE 0.12 % MT SOLN
15.0000 mL | Freq: Once | OROMUCOSAL | Status: AC
  Administered 2020-04-21: 15 mL via OROMUCOSAL
  Filled 2020-04-21: qty 15

## 2020-04-21 MED ORDER — ALBUMIN HUMAN 5 % IV SOLN: INTRAVENOUS | Status: DC | PRN

## 2020-04-21 MED ORDER — ROCURONIUM BROMIDE 10 MG/ML (PF) SYRINGE
PREFILLED_SYRINGE | INTRAVENOUS | Status: DC | PRN
  Administered 2020-04-21 (×2): 20 mg via INTRAVENOUS
  Administered 2020-04-21: 70 mg via INTRAVENOUS
  Administered 2020-04-21 (×2): 20 mg via INTRAVENOUS

## 2020-04-21 MED ORDER — PHENYLEPHRINE HCL-NACL 10-0.9 MG/250ML-% IV SOLN
INTRAVENOUS | Status: DC | PRN
  Administered 2020-04-21: 25 ug/min via INTRAVENOUS

## 2020-04-21 MED ORDER — PHENOL 1.4 % MT LIQD: 1.0000 | OROMUCOSAL | Status: DC | PRN

## 2020-04-21 MED ORDER — PHENYLEPHRINE HCL (PRESSORS) 10 MG/ML IV SOLN
INTRAVENOUS | Status: AC
  Filled 2020-04-21: qty 1

## 2020-04-21 MED ORDER — LIDOCAINE 2% (20 MG/ML) 5 ML SYRINGE
INTRAMUSCULAR | Status: DC | PRN
  Administered 2020-04-21: 60 mg via INTRAVENOUS

## 2020-04-21 MED ORDER — DEXAMETHASONE SODIUM PHOSPHATE 10 MG/ML IJ SOLN
INTRAMUSCULAR | Status: DC | PRN
  Administered 2020-04-21: 10 mg via INTRAVENOUS

## 2020-04-21 MED ORDER — SENNA 8.6 MG PO TABS
1.0000 | ORAL_TABLET | Freq: Two times a day (BID) | ORAL | Status: DC
  Administered 2020-04-21 – 2020-04-24 (×6): 8.6 mg via ORAL
  Filled 2020-04-21 (×6): qty 1

## 2020-04-21 MED ORDER — LOSARTAN POTASSIUM 50 MG PO TABS
100.0000 mg | ORAL_TABLET | Freq: Every day | ORAL | Status: DC
  Administered 2020-04-21 – 2020-04-23 (×3): 100 mg via ORAL
  Filled 2020-04-21 (×3): qty 2

## 2020-04-21 MED ORDER — DEXAMETHASONE SODIUM PHOSPHATE 10 MG/ML IJ SOLN
10.0000 mg | Freq: Once | INTRAMUSCULAR | Status: DC
  Filled 2020-04-21: qty 1

## 2020-04-21 MED ORDER — MIDAZOLAM HCL 2 MG/2ML IJ SOLN
INTRAMUSCULAR | Status: AC
  Filled 2020-04-21: qty 2

## 2020-04-21 MED ORDER — HYDROCHLOROTHIAZIDE 25 MG PO TABS
25.0000 mg | ORAL_TABLET | Freq: Every day | ORAL | Status: DC
  Administered 2020-04-22 – 2020-04-24 (×3): 25 mg via ORAL
  Filled 2020-04-21 (×3): qty 1

## 2020-04-21 MED ORDER — GABAPENTIN 300 MG PO CAPS
300.0000 mg | ORAL_CAPSULE | ORAL | Status: AC
  Administered 2020-04-21: 300 mg via ORAL
  Filled 2020-04-21: qty 1

## 2020-04-21 MED ORDER — METHOCARBAMOL 1000 MG/10ML IJ SOLN
500.0000 mg | Freq: Four times a day (QID) | INTRAVENOUS | Status: DC | PRN
  Filled 2020-04-21: qty 5

## 2020-04-21 MED ORDER — DEXAMETHASONE SODIUM PHOSPHATE 10 MG/ML IJ SOLN: INTRAMUSCULAR | Status: DC | PRN

## 2020-04-21 MED ORDER — MENTHOL 3 MG MT LOZG: 1.0000 | LOZENGE | OROMUCOSAL | Status: DC | PRN

## 2020-04-21 MED ORDER — CEFAZOLIN SODIUM-DEXTROSE 2-4 GM/100ML-% IV SOLN
2.0000 g | Freq: Three times a day (TID) | INTRAVENOUS | Status: AC
  Administered 2020-04-21 – 2020-04-22 (×2): 2 g via INTRAVENOUS
  Filled 2020-04-21 (×2): qty 100

## 2020-04-21 MED ORDER — EPHEDRINE SULFATE-NACL 50-0.9 MG/10ML-% IV SOSY
PREFILLED_SYRINGE | INTRAVENOUS | Status: DC | PRN
  Administered 2020-04-21: 5 mg via INTRAVENOUS

## 2020-04-21 MED ORDER — OXYCODONE HCL 5 MG/5ML PO SOLN
5.0000 mg | Freq: Once | ORAL | Status: DC | PRN
Start: 2020-04-21 — End: 2020-04-21

## 2020-04-21 MED ORDER — DEXAMETHASONE SODIUM PHOSPHATE 4 MG/ML IJ SOLN
4.0000 mg | Freq: Four times a day (QID) | INTRAMUSCULAR | Status: DC
  Administered 2020-04-21 – 2020-04-22 (×2): 4 mg via INTRAVENOUS
  Filled 2020-04-21 (×2): qty 1

## 2020-04-21 MED ORDER — THROMBIN 5000 UNITS EX KIT
PACK | CUTANEOUS | Status: AC
  Filled 2020-04-21: qty 1

## 2020-04-21 MED ORDER — SODIUM CHLORIDE 0.9% FLUSH
3.0000 mL | Freq: Two times a day (BID) | INTRAVENOUS | Status: DC
  Administered 2020-04-22 (×2): 3 mL via INTRAVENOUS

## 2020-04-21 MED ORDER — SODIUM CHLORIDE (PF) 0.9 % IJ SOLN
INTRAMUSCULAR | Status: AC
  Filled 2020-04-21: qty 10

## 2020-04-21 MED ORDER — ONDANSETRON HCL 4 MG/2ML IJ SOLN
INTRAMUSCULAR | Status: DC | PRN
  Administered 2020-04-21: 4 mg via INTRAVENOUS

## 2020-04-21 MED ORDER — ROCURONIUM BROMIDE 10 MG/ML (PF) SYRINGE
PREFILLED_SYRINGE | INTRAVENOUS | Status: AC
  Filled 2020-04-21: qty 10

## 2020-04-21 MED ORDER — FENTANYL CITRATE (PF) 100 MCG/2ML IJ SOLN
INTRAMUSCULAR | Status: AC
  Administered 2020-04-21: 50 ug via INTRAVENOUS
  Filled 2020-04-21: qty 2

## 2020-04-21 MED ORDER — ONDANSETRON HCL 4 MG/2ML IJ SOLN
INTRAMUSCULAR | Status: AC
  Filled 2020-04-21: qty 2

## 2020-04-21 MED ORDER — THROMBIN 20000 UNITS EX SOLR
CUTANEOUS | Status: DC | PRN
  Administered 2020-04-21: 20 mL via TOPICAL

## 2020-04-21 MED ORDER — METHOCARBAMOL 500 MG PO TABS
500.0000 mg | ORAL_TABLET | Freq: Four times a day (QID) | ORAL | Status: DC | PRN
  Administered 2020-04-22 – 2020-04-24 (×7): 500 mg via ORAL
  Filled 2020-04-21 (×8): qty 1

## 2020-04-21 MED ORDER — ONDANSETRON HCL 4 MG/2ML IJ SOLN: 4.0000 mg | Freq: Four times a day (QID) | INTRAMUSCULAR | Status: DC | PRN

## 2020-04-21 SURGICAL SUPPLY — 66 items
BASKET BONE COLLECTION (BASKET) ×2 IMPLANT
BENZOIN TINCTURE PRP APPL 2/3 (GAUZE/BANDAGES/DRESSINGS) ×2 IMPLANT
BLADE CLIPPER SURG (BLADE) IMPLANT
BUR CARBIDE MATCH 3.0 (BURR) ×2 IMPLANT
CAGE LORD EXP 8X10X25 20D (Cage) ×4 IMPLANT
CAGE LORD PSX 10X10X25 20D (Cage) ×8 IMPLANT
CANISTER SUCT 3000ML PPV (MISCELLANEOUS) ×2 IMPLANT
CNTNR URN SCR LID CUP LEK RST (MISCELLANEOUS) ×1 IMPLANT
CONT SPEC 4OZ STRL OR WHT (MISCELLANEOUS) ×2
COVER BACK TABLE 60X90IN (DRAPES) ×2 IMPLANT
COVER WAND RF STERILE (DRAPES) ×2 IMPLANT
DERMABOND ADVANCED (GAUZE/BANDAGES/DRESSINGS) ×1
DERMABOND ADVANCED .7 DNX12 (GAUZE/BANDAGES/DRESSINGS) ×1 IMPLANT
DIFFUSER DRILL AIR PNEUMATIC (MISCELLANEOUS) ×2 IMPLANT
DRAPE C-ARM 42X72 X-RAY (DRAPES) ×4 IMPLANT
DRAPE C-ARMOR (DRAPES) ×2 IMPLANT
DRAPE LAPAROTOMY 100X72X124 (DRAPES) ×2 IMPLANT
DRAPE SURG 17X23 STRL (DRAPES) ×2 IMPLANT
DRSG OPSITE 4X5.5 SM (GAUZE/BANDAGES/DRESSINGS) ×4 IMPLANT
DRSG OPSITE POSTOP 4X10 (GAUZE/BANDAGES/DRESSINGS) ×2 IMPLANT
DURAPREP 26ML APPLICATOR (WOUND CARE) ×2 IMPLANT
ELECT REM PT RETURN 9FT ADLT (ELECTROSURGICAL) ×2
ELECTRODE REM PT RTRN 9FT ADLT (ELECTROSURGICAL) ×1 IMPLANT
EVACUATOR 1/8 PVC DRAIN (DRAIN) ×2 IMPLANT
GAUZE 4X4 16PLY RFD (DISPOSABLE) ×2 IMPLANT
GAUZE SPONGE 4X4 12PLY STRL (GAUZE/BANDAGES/DRESSINGS) ×2 IMPLANT
GLOVE BIO SURGEON STRL SZ7 (GLOVE) ×2 IMPLANT
GLOVE BIO SURGEON STRL SZ8 (GLOVE) ×4 IMPLANT
GLOVE SURG POLYISO LF SZ7 (GLOVE) ×8 IMPLANT
GLOVE SURG UNDER POLY LF SZ7 (GLOVE) IMPLANT
GLOVE SURG UNDER POLY LF SZ7.5 (GLOVE) ×6 IMPLANT
GOWN STRL REUS W/ TWL LRG LVL3 (GOWN DISPOSABLE) ×2 IMPLANT
GOWN STRL REUS W/ TWL XL LVL3 (GOWN DISPOSABLE) ×3 IMPLANT
GOWN STRL REUS W/TWL 2XL LVL3 (GOWN DISPOSABLE) IMPLANT
GOWN STRL REUS W/TWL LRG LVL3 (GOWN DISPOSABLE) ×4
GOWN STRL REUS W/TWL XL LVL3 (GOWN DISPOSABLE) ×6
GRAFT TRINITY ELITE LGE HUMAN (Tissue) ×2 IMPLANT
HEMOSTAT POWDER KIT SURGIFOAM (HEMOSTASIS) ×4 IMPLANT
KIT BASIN OR (CUSTOM PROCEDURE TRAY) ×2 IMPLANT
KIT TURNOVER KIT B (KITS) ×2 IMPLANT
MILL MEDIUM DISP (BLADE) ×2 IMPLANT
NEEDLE HYPO 25X1 1.5 SAFETY (NEEDLE) ×2 IMPLANT
NS IRRIG 1000ML POUR BTL (IV SOLUTION) ×6 IMPLANT
PACK LAMINECTOMY NEURO (CUSTOM PROCEDURE TRAY) ×2 IMPLANT
PAD ARMBOARD 7.5X6 YLW CONV (MISCELLANEOUS) IMPLANT
ROD LORD LIPPED TI 5.5X80 (Rod) ×2 IMPLANT
ROD LORD LIPPED TI 5.5X90 (Rod) ×2 IMPLANT
SCREW CANC SHANK MOD 5.5X35 (Screw) ×4 IMPLANT
SCREW CORT SHANK MOD 6.5X40 (Screw) ×4 IMPLANT
SCREW POLYAXIAL TULIP (Screw) ×16 IMPLANT
SCREW SHANK MOD 5.5X40 (Screw) ×8 IMPLANT
SET SCREW (Screw) ×16 IMPLANT
SET SCREW SPNE (Screw) ×8 IMPLANT
SPONGE LAP 4X18 RFD (DISPOSABLE) IMPLANT
SPONGE SURGIFOAM ABS GEL 100 (HEMOSTASIS) ×2 IMPLANT
STRIP CLOSURE SKIN 1/2X4 (GAUZE/BANDAGES/DRESSINGS) ×2 IMPLANT
SUT VIC AB 0 CT1 18XCR BRD8 (SUTURE) ×2 IMPLANT
SUT VIC AB 0 CT1 8-18 (SUTURE) ×4
SUT VIC AB 2-0 CP2 18 (SUTURE) ×4 IMPLANT
SUT VIC AB 3-0 SH 8-18 (SUTURE) ×4 IMPLANT
SYR CONTROL 10ML LL (SYRINGE) ×2 IMPLANT
TOWEL GREEN STERILE (TOWEL DISPOSABLE) ×2 IMPLANT
TOWEL GREEN STERILE FF (TOWEL DISPOSABLE) ×2 IMPLANT
TRAY FOLEY MTR SLVR 14FR STAT (SET/KITS/TRAYS/PACK) ×2 IMPLANT
TRAY FOLEY MTR SLVR 16FR STAT (SET/KITS/TRAYS/PACK) IMPLANT
WATER STERILE IRR 1000ML POUR (IV SOLUTION) ×2 IMPLANT

## 2020-04-21 NOTE — Op Note (Signed)
04/21/2020  4:47 PM  PATIENT:  Lauren Carter  70 y.o. 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 Decompressive lumbar laminectomy hemifacetectomy foraminotomies L3-4 L4-5 L5-S1 requiring more work than would be required for a simple exposure of the disk for PLIF in order to adequately decompress the neural elements and address the spinal stenosis 2. Posterior lumbar interbody fusion L3-4 L4-5 L5-S1 inclusive using PSX expandable interbody cages packed with morcellized allograft and autograft  3. Posterior fixation L3-S1 inclusive using Alphatec cortical pedicle screws.  4. Intertransverse arthrodesis L3-S1 using morcellized autograft and allograft.  SURGEON:  Sherley Bounds, MD  ASSISTANTS: Glenford Peers, FNP  ANESTHESIA:  General  EBL: 600 ml  Total I/O In: 2300 [I.V.:1800; Blood:250; IV Piggyback:250] Out: 750 [Urine:150; Blood:600]  BLOOD ADMINISTERED:250 CC CELLSAVER  DRAINS: none   INDICATION FOR PROCEDURE: This patient presented with back pain with radiculopathy. Imaging revealed degenerative spondylolisthesis L3-4 L4-5 L5-S1. The patient tried a reasonable attempt at conservative medical measures without relief. I recommended decompression and instrumented fusion to address the stenosis as well as the segmental  instability.  Patient understood the risks, benefits, and alternatives and potential outcomes and wished to proceed.  PROCEDURE DETAILS:  The patient was brought to the operating room. After induction of generalized endotracheal anesthesia the patient was rolled into the prone position on chest rolls and all pressure points were padded. The patient's lumbar region was cleaned and then prepped with DuraPrep and draped in the usual sterile fashion. Anesthesia was injected and then a dorsal midline incision was made and carried  down to the lumbosacral fascia. The fascia was opened and the paraspinous musculature was taken down in a subperiosteal fashion to expose L3-S1 bilaterally. A self-retaining retractor was placed. Intraoperative fluoroscopy confirmed my level, and I started with placement of the L3 cortical pedicle screws. The pedicle screw entry zones were identified utilizing surface landmarks and  AP and lateral fluoroscopy. I scored the cortex with the high-speed drill and then used the hand drill to drill an upward and outward direction into the pedicle. I then tapped line to line. I then placed a 5.5 x 35 mm cortical pedicle screw into the pedicles of L3 bilaterally.  I then turned my attention to the decompression and complete lumbar laminectomies, hemi- facetectomies, and foraminotomies were performed at L3-4 L4-5 and L5-S1.  My nurse practitioner was directly involved in the decompression and exposure of the neural elements. the patient had significant spinal stenosis and this required more work than would be required for a simple exposure of the disc for posterior lumbar interbody fusion which would only require a limited laminotomy. Much more generous decompression and generous foraminotomy was undertaken in order to adequately decompress the neural elements and address the patient's leg pain. The yellow ligament was removed to expose the underlying dura and nerve roots, and generous foraminotomies were performed to adequately decompress the neural elements. Both the exiting and traversing nerve roots were decompressed on both sides until a coronary dilator passed easily along the nerve roots. Once the decompression was complete, I turned my attention to the posterior lower lumbar interbody fusion. The epidural venous vasculature was coagulated and cut sharply. Disc space was incised and the initial discectomy was performed with pituitary rongeurs. The disc space was distracted with sequential distractors to a height of 8  mm at L3-4 and 10 mm at L4-5  and L5-S1. We then used a series of scrapers and shavers to prepare the endplates for fusion. The midline was prepared with Epstein curettes. Once the complete discectomy was finished, we packed an appropriate sized interbody cage with local autograft and morcellized allograft, gently retracted the nerve root, and tapped the cage into position at L3-4 L4-5 L5-S1.  The midline between the cages was packed with morselized autograft and allograft. We then turned our attention to the placement of the lower pedicle screws. The pedicle screw entry zones were identified utilizing surface landmarks and fluoroscopy. I drilled into each pedicle utilizing the hand drill, and tapped each pedicle with the appropriate tap. We palpated with a ball probe to assure no break in the cortex. We then placed 5.5 x 40 mm cortical pedicle screws into the pedicles bilaterally at L4 and L5 bilaterally and 6.5 x 40 mm pedicle screws at S1 bilaterally achieving bicortical purchase.  My nurse practitioner assisted in placement of the pedicle screws.  We then decorticated the transverse processes and laid a mixture of morcellized autograft and allograft out over these to perform intertransverse arthrodesis at L3-S1. We then placed lordotic rods into the multiaxial screw heads of the pedicle screws and locked these in position with the locking caps and anti-torque device. We then checked our construct with AP and lateral fluoroscopy. Irrigated with copious amounts of bacitracin-containing saline solution. Inspected the nerve roots once again to assure adequate decompression, lined to the dura with Gelfoam, placed powdered vancomycin into the wound, and then we closed the muscle and the fascia with 0 Vicryl. Closed the subcutaneous tissues with 2-0 Vicryl and subcuticular tissues with 3-0 Vicryl. The skin was closed with benzoin and Steri-Strips. Dressing was then applied, the patient was awakened from general  anesthesia and transported to the recovery room in stable condition. At the end of the procedure all sponge, needle and instrument counts were correct.   PLAN OF CARE: admit to inpatient  PATIENT DISPOSITION:  PACU - hemodynamically stable.   Delay start of Pharmacological VTE agent (>24hrs) due to surgical blood loss or risk of bleeding:  yes

## 2020-04-21 NOTE — Anesthesia Procedure Notes (Signed)
Procedure Name: Intubation Date/Time: 04/21/2020 11:11 AM Performed by: Rande Brunt, CRNA Pre-anesthesia Checklist: Patient identified, Emergency Drugs available, Suction available and Patient being monitored Patient Re-evaluated:Patient Re-evaluated prior to induction Oxygen Delivery Method: Circle System Utilized Preoxygenation: Pre-oxygenation with 100% oxygen Induction Type: IV induction Ventilation: Mask ventilation without difficulty Laryngoscope Size: Mac and 4 Grade View: Grade I Tube type: Oral Tube size: 7.0 mm Number of attempts: 1 Airway Equipment and Method: Stylet and Oral airway Placement Confirmation: ETT inserted through vocal cords under direct vision,  positive ETCO2 and breath sounds checked- equal and bilateral Secured at: 22 cm Tube secured with: Tape Dental Injury: Teeth and Oropharynx as per pre-operative assessment

## 2020-04-21 NOTE — Anesthesia Preprocedure Evaluation (Addendum)
Anesthesia Evaluation  Patient identified by MRN, date of birth, ID band Patient awake    Reviewed: Allergy & Precautions, NPO status , Patient's Chart, lab work & pertinent test results  History of Anesthesia Complications Negative for: history of anesthetic complications  Airway Mallampati: I  TM Distance: >3 FB Neck ROM: Full    Dental  (+) Dental Advisory Given, Teeth Intact   Pulmonary neg pulmonary ROS,    Pulmonary exam normal        Cardiovascular hypertension, Pt. on medications Normal cardiovascular exam     Neuro/Psych negative neurological ROS  negative psych ROS   GI/Hepatic GERD  Medicated and Controlled, Liver cyst    Endo/Other   Obesity   Renal/GU negative Renal ROS     Musculoskeletal  (+) Arthritis ,   Abdominal   Peds  Hematology negative hematology ROS (+)   Anesthesia Other Findings Covid test negative   Reproductive/Obstetrics                            Anesthesia Physical Anesthesia Plan  ASA: II  Anesthesia Plan: General   Post-op Pain Management:    Induction: Intravenous  PONV Risk Score and Plan: 3 and Treatment may vary due to age or medical condition, Ondansetron and Dexamethasone  Airway Management Planned: Oral ETT  Additional Equipment: None  Intra-op Plan:   Post-operative Plan: Extubation in OR  Informed Consent: I have reviewed the patients History and Physical, chart, labs and discussed the procedure including the risks, benefits and alternatives for the proposed anesthesia with the patient or authorized representative who has indicated his/her understanding and acceptance.     Dental advisory given  Plan Discussed with: CRNA and Anesthesiologist  Anesthesia Plan Comments:        Anesthesia Quick Evaluation

## 2020-04-21 NOTE — Transfer of Care (Signed)
Immediate Anesthesia Transfer of Care Note  Patient: Lauren Carter  Procedure(s) Performed: Posterior Lumbar interbody Fusion - Lumbar three-Lumbar four - Lumbar four-Lumbar five - Lumbar five-Sacral one (N/A Back)  Patient Location: PACU  Anesthesia Type:General  Level of Consciousness: awake, alert  and oriented  Airway & Oxygen Therapy: Patient Spontanous Breathing and Patient connected to face mask oxygen  Post-op Assessment: Report given to RN and Post -op Vital signs reviewed and stable  Post vital signs: Reviewed and stable  Last Vitals:  Vitals Value Taken Time  BP 107/79 04/21/20 1651  Temp    Pulse 80 04/21/20 1654  Resp 14 04/21/20 1654  SpO2 98 % 04/21/20 1654  Vitals shown include unvalidated device data.  Last Pain:  Vitals:   04/21/20 0934  PainSc: 6       Patients Stated Pain Goal: 3 (43/27/61 4709)  Complications: No complications documented.

## 2020-04-21 NOTE — H&P (Signed)
Subjective: Patient is a 70 y.o. female admitted for back pain. Onset of symptoms was several months ago, gradually worsening since that time.  The pain is rated severe, and is located at the across the lower back and radiates to hips. The pain is described as aching and occurs all day. The symptoms have been progressive. Symptoms are exacerbated by exercise and standing. MRI or CT showed spondylolisthesis   Past Medical History:  Diagnosis Date  . Arthritis   . Cyst of ovary   . GERD (gastroesophageal reflux disease)   . History of kidney stones   . Hyperlipidemia   . Hypertension   . Liver cyst    per imaging 08 /2018  . Ovarian cyst    Right  . Renal calculus, left   . Wears contact lenses     Past Surgical History:  Procedure Laterality Date  . CATARACT EXTRACTION W/ INTRAOCULAR LENS  IMPLANT, BILATERAL Bilateral   . CYSTOSCOPY N/A 11/16/2017   Procedure: CYSTOSCOPY;  Surgeon: Benjaman Kindler, MD;  Location: [facility];  Service: Gynecology;  Laterality: N/A;  . CYSTOSCOPY WITH RETROGRADE PYELOGRAM, URETEROSCOPY AND STENT PLACEMENT Left 02/26/2017   Procedure: CYSTOSCOPY WITH RETROGRADE PYELOGRAM, URETEROSCOPY AND STENT PLACEMENT, STONE BASKET RETRIVAL;  Surgeon: Cleon Gustin, MD;  Location: [facility];  Service: Urology;  Laterality: Left;  . EXCISION MASS, RECONSTRUCTION LEFT INDEX FINGER NAIL BED  10-11-2010    dr Fredna Dow  [facility]  . HAMMER TOE SURGERY Right 02/2016  . HOLMIUM LASER APPLICATION Left 07/23/9474   Procedure: HOLMIUM LASER APPLICATION;  Surgeon: Cleon Gustin, MD;  Location: [facility];  Service: Urology;  Laterality: Left;  . KNEE ARTHROSCOPY Left 1992  . LAPAROSCOPIC BILATERAL SALPINGO OOPHERECTOMY Bilateral 11/16/2017   Procedure: LAPAROSCOPIC BILATERAL SALPINGO OOPHORECTOMY;  Surgeon: Benjaman Kindler, MD;  Location: [facility];  Service: Gynecology;  Laterality: Bilateral;  . LAPAROSCOPIC LYSIS OF ADHESIONS N/A 11/16/2017    Procedure: LAPAROSCOPIC LYSIS OF ADHESIONS;  Surgeon: Benjaman Kindler, MD;  Location: [facility];  Service: Gynecology;  Laterality: N/A;  . TOTAL KNEE ARTHROPLASTY Left 04-14-2014    dr Lenna Sciara. [facility]  [place]  . VAGINAL HYSTERECTOMY  2000    Prior to Admission medications   Medication Sig Start Date End Date Taking? Authorizing Provider  B Complex-C (SUPER B COMPLEX PO) Take 1 capsule by mouth daily.   Yes [provider]  Calcium Carb-Cholecalciferol (CALCIUM 600 + D PO) Take 2 tablets by mouth daily.   Yes [provider]  cetirizine (ZYRTEC) 10 MG tablet Take 10 mg by mouth daily.   Yes [provider]  Cholecalciferol (VITAMIN D) 2000 units tablet Take 2,000 Units by mouth daily.   Yes [provider]  Glucosamine HCl-MSM (GLUCOSAMINE-MSM PO) Take 2 tablets by mouth daily.    Yes [provider]  hydrochlorothiazide (HYDRODIURIL) 25 MG tablet Take 25 mg by mouth daily.   Yes [provider]  losartan (COZAAR) 100 MG tablet Take 100 mg by mouth at bedtime.   Yes [provider]  LUTEIN-ZEAXANTHIN PO Take 1 capsule by mouth daily.   Yes [provider]  Multiple Vitamins-Minerals (CENTRUM SILVER 50+WOMEN PO) Take 1 tablet by mouth daily.   Yes [provider]  omeprazole (PRILOSEC) 20 MG capsule Take 20 mg by mouth daily.    Yes [provider]  pravastatin (PRAVACHOL) 20 MG tablet Take 20 mg by mouth at bedtime.   Yes [provider]  aspirin EC 81  MG tablet Take 81 mg by mouth daily.  Patient not taking: Reported on 04/07/2020    [provider]  docusate sodium (COLACE) 100 MG capsule Take 1 capsule (100 mg total) by mouth 2 (two) times daily. To keep stools soft Patient not taking: No sig reported 11/16/17   Benjaman Kindler, MD  gabapentin (NEURONTIN) 800 MG tablet Take 1 tablet (800 mg total) by mouth at bedtime for 14 days. Take nightly for 3 days, then up to 14 days as needed 11/16/17  11/30/17  Benjaman Kindler, MD  oxycodone (OXY-IR) 5 MG capsule Take 1 capsule (5 mg total) by mouth every 6 (six) hours as needed for pain. Patient not taking: Reported on 04/07/2020 11/16/17   Benjaman Kindler, MD  PARoxetine (PAXIL) 20 MG tablet Take 20 mg by mouth every evening.    [provider]   Allergies  Allergen Reactions  . Ibuprofen Other (See Comments)    Causes GI upset     Social History   Tobacco Use  . Smoking status: Never Smoker  . Smokeless tobacco: Never Used  Substance Use Topics  . Alcohol use: Yes    Alcohol/week: 1.0 standard drink    Types: 1 Glasses of wine per week    Family History  Problem Relation Age of Onset  . Breast cancer Mother 68     Review of Systems  Positive ROS: neg  All other systems have been reviewed and were otherwise negative with the exception of those mentioned in the HPI and as above.  Objective: Vital signs in last 24 hours: Temp:  [98.4 F (36.9 C)] 98.4 F (36.9 C) (03/02 0904) Pulse Rate:  [80] 80 (03/02 0904) Resp:  [18] 18 (03/02 0904) BP: (138)/(78) 138/78 (03/02 0904) SpO2:  [99 %] 99 % (03/02 0904) Weight:  [91.1 kg] 91.1 kg (03/02 0904)  General Appearance: Alert, cooperative, no distress, appears stated age Head: Normocephalic, without obvious abnormality, atraumatic Eyes: PERRL, conjunctiva/corneas clear, EOM's intact    Neck: Supple, symmetrical, trachea midline Back: Symmetric, no curvature, ROM normal, no CVA tenderness Lungs:  respirations unlabored Heart: Regular rate and rhythm Abdomen: Soft, non-tender Extremities: Extremities normal, atraumatic, no cyanosis or edema Pulses: 2+ and symmetric all extremities Skin: Skin color, texture, turgor normal, no rashes or lesions  NEUROLOGIC:   Mental status: Alert and oriented x4,  no aphasia, good attention span, fund of knowledge, and memory Motor Exam - grossly normal Sensory Exam - grossly normal Reflexes: 1+ Coordination - grossly  normal Gait - grossly normal Balance - grossly normal Cranial Nerves: I: smell Not tested  II: visual acuity  OS: nl    OD: nl  II: visual fields Full to confrontation  II: pupils Equal, round, reactive to light  III,VII: ptosis None  III,IV,VI: extraocular muscles  Full ROM  V: mastication Normal  V: facial light touch sensation  Normal  V,VII: corneal reflex  Present  VII: facial muscle function - upper  Normal  VII: facial muscle function - lower Normal  VIII: hearing Not tested  IX: soft palate elevation  Normal  IX,X: gag reflex Present  XI: trapezius strength  5/5  XI: sternocleidomastoid strength 5/5  XI: neck flexion strength  5/5  XII: tongue strength  Normal    Data Review Lab Results  Component Value Date   WBC 6.6 04/19/2020   HGB 15.0 04/19/2020   HCT 45.2 04/19/2020   MCV 88.3 04/19/2020   PLT 223 04/19/2020   Lab Results  Component Value Date   NA 142 04/19/2020   K 3.3 (L) 04/19/2020   CL 105 04/19/2020   CO2 26 04/19/2020   BUN 12 04/19/2020   CREATININE 0.74 04/19/2020   GLUCOSE 96 04/19/2020   Lab Results  Component Value Date   INR 0.9 04/19/2020    Assessment/Plan:  Estimated body mass index is 34.47 kg/m as calculated from the following:   Height as of this encounter: 5\' 4"  (1.626 m).   Weight as of this encounter: 91.1 kg. Patient admitted for PLIF L3-4 l4-5 l5-s1. Patient has failed a reasonable attempt at conservative therapy.  I explained the condition and procedure to the patient and answered any questions.  Patient wishes to proceed with procedure as planned. Understands risks/ benefits and typical outcomes of procedure.   Eustace Moore 04/21/2020 10:50 AM

## 2020-04-22 MED ORDER — HYDROCODONE-ACETAMINOPHEN 10-325 MG PO TABS
1.0000 | ORAL_TABLET | ORAL | Status: DC | PRN
  Administered 2020-04-22 – 2020-04-23 (×6): 2 via ORAL
  Administered 2020-04-24: 1 via ORAL
  Administered 2020-04-24: 2 via ORAL
  Filled 2020-04-22 (×5): qty 2
  Filled 2020-04-22: qty 1
  Filled 2020-04-22 (×2): qty 2

## 2020-04-22 MED ORDER — CELECOXIB 200 MG PO CAPS
200.0000 mg | ORAL_CAPSULE | Freq: Two times a day (BID) | ORAL | Status: DC
  Administered 2020-04-22 – 2020-04-24 (×5): 200 mg via ORAL
  Filled 2020-04-22 (×5): qty 1

## 2020-04-22 MED FILL — Heparin Sodium (Porcine) Inj 1000 Unit/ML: INTRAMUSCULAR | Qty: 30 | Status: AC

## 2020-04-22 MED FILL — Sodium Chloride Irrigation Soln 0.9%: Qty: 3000 | Status: AC

## 2020-04-22 MED FILL — Thrombin For Soln 5000 Unit: CUTANEOUS | Qty: 5000 | Status: AC

## 2020-04-22 MED FILL — Sodium Chloride IV Soln 0.9%: INTRAVENOUS | Qty: 1000 | Status: AC

## 2020-04-22 NOTE — Evaluation (Signed)
Occupational Therapy Evaluation and Discharge Patient Details Name: Lauren Carter MRN: [id number] DOB: [birth year] Today's Date: 04/22/2020    History of Present Illness This 70 yo female s/p decompressive lumbar laminectomy hemifacetectomy foraminotomies L3-4 L4-5 L5-S1,Posterior lumbar interbody fusion L3-4 L4-5 L5-S1   Clinical Impression   This 70 yo female admitted with above presents to acute OT with all education completed, we will D/C from acute OT.     Follow Up Recommendations  No OT follow up;Supervision - Intermittent    Equipment Recommendations  None recommended by OT       Precautions / Restrictions Precautions Precautions: Back Precaution Booklet Issued: Yes (comment) Precaution Comments: drain Required Braces or Orthoses: Spinal Brace Spinal Brace: Lumbar corset;Applied in sitting position Restrictions Weight Bearing Restrictions: No      Mobility Bed Mobility Overal bed mobility: Needs Assistance Bed Mobility: Rolling;Sidelying to Sit Rolling: Supervision Sidelying to sit: Supervision;HOB elevated       General bed mobility comments: VCs for technique    Transfers Overall transfer level: Needs assistance Equipment used: None Transfers: Sit to/from Stand Sit to Stand: Supervision              Balance Overall balance assessment: Needs assistance Sitting-balance support: No upper extremity supported;Feet supported Sitting balance-Leahy Scale: Good     Standing balance support: No upper extremity supported;During functional activity Standing balance-Leahy Scale: Fair Standing balance comment: standing at sink to wash hands and brush teeth                           ADL either performed or assessed with clinical judgement   ADL Overall ADL's : Needs assistance/impaired Eating/Feeding: Independent;Sitting   Grooming: Set up;Standing;Wash/dry hands;Oral care Grooming Details (indicate cue type and reason): Used two cups to  brush teeth so as to not bend over the sink Upper Body Bathing: Set up;Sitting   Lower Body Bathing: Moderate assistance Lower Body Bathing Details (indicate cue type and reason): S (sit<>stand); A to bath lower legs and feet Upper Body Dressing : Set up;Sitting Upper Body Dressing Details (indicate cue type and reason): including brace Lower Body Dressing: Maximal assistance Lower Body Dressing Details (indicate cue type and reason): S (sit<>stand); A for LLE for LBD (husband will A per her report) Toilet Transfer: Supervision/safety;Ambulation;Comfort height toilet;Grab bars Toilet Transfer Details (indicate cue type and reason): Did discuss a stand<>sit stance that makes it eaiser to keep back straight and she was able to do this for sit<>stand from toilet Toileting- Clothing Manipulation and Hygiene: Modified independent;Sit to/from stand               Vision Patient Visual Report: No change from baseline              Pertinent Vitals/Pain Pain Assessment: 0-10 Pain Score: 8  Pain Location: incisional Pain Descriptors / Indicators: Aching;Sore;Grimacing;Guarding Pain Intervention(s): Limited activity within patient's tolerance;Monitored during session;Repositioned     Hand Dominance Right   Extremity/Trunk Assessment Upper Extremity Assessment Upper Extremity Assessment: Overall WFL for tasks assessed           Communication Communication Communication: No difficulties   Cognition Arousal/Alertness: Awake/alert Behavior During Therapy: WFL for tasks assessed/performed Overall Cognitive Status: Within Functional Limits for tasks assessed  Home Living Family/patient expects to be discharged to:: Private residence Living Arrangements: Spouse/significant other Available Help at Discharge: Family;Available 24 hours/day Type of Home: House Home Access: Stairs to enter [employer] of  Steps: 4 Entrance Stairs-Rails: Right Home Layout: Multi-level;Bed/bath upstairs Alternate Level Stairs-Number of Steps: stairs-landing-stairs Alternate Level Stairs-Rails: Right Bathroom Shower/Tub: Walk-in shower;Door   [employer] Toilet: Handicapped height Bathroom Accessibility: Yes How Accessible: Accessible via walker Home Equipment: Grab bars - tub/shower;Hand held shower head;Grab bars - toilet;Shower seat - built in          Prior Functioning/Environment Level of Independence: Independent                 OT Problem List: Decreased range of motion;Impaired balance (sitting and/or standing);Pain         OT Goals(Current goals can be found in the care plan section) Acute Rehab OT Goals Patient Stated Goal: to hopefully go home tomorrow  OT Frequency:                AM-PAC OT "6 Clicks" Daily Activity     Outcome Measure Help from another person eating meals?: None Help from another person taking care of personal grooming?: A Little Help from another person toileting, which includes using toliet, bedpan, or urinal?: A Little Help from another person bathing (including washing, rinsing, drying)?: A Lot Help from another person to put on and taking off regular upper body clothing?: A Little Help from another person to put on and taking off regular lower body clothing?: A Lot 6 Click Score: 17   End of Session Equipment Utilized During Treatment: Back brace Nurse Communication: Mobility status (no further OT needs)  Activity Tolerance: Patient tolerated treatment well Patient left: in chair;with call bell/phone within reach  OT Visit Diagnosis: Unsteadiness on feet (R26.81);Muscle weakness (generalized) (M62.81);Pain Pain - part of body:  (incisiona)                Time: 4665-9935 OT Time Calculation (min): 22 min Charges:  OT General Charges $OT Visit: 1 Visit OT Evaluation $OT Eval Moderate Complexity: [place], OTR/L Acute [employer] Pager [phone] Office [phone]     Almon Register 04/22/2020, 10:04 AM

## 2020-04-22 NOTE — Progress Notes (Signed)
Subjective: Patient reports moderate back pain and some right quadricep aching.   Objective: Vital signs in last 24 hours: Temp:  [97 F (36.1 C)-98.5 F (36.9 C)] 97.9 F (36.6 C) (03/03 0759) Pulse Rate:  [57-83] 77 (03/03 0759) Resp:  [7-20] 18 (03/03 0759) BP: (101-138)/(55-90) 122/61 (03/03 0759) SpO2:  [93 %-100 %] 96 % (03/03 0759) Weight:  [91.1 kg] 91.1 kg (03/02 0904)  Intake/Output from previous day: 03/02 0701 - 03/03 0700 In: 2300 [I.V.:1800; Blood:250; IV Piggyback:250] Out: 1860 [Urine:900; Drains:360; Blood:600] Intake/Output this shift: No intake/output data recorded.  Neurologic: Grossly normal  Lab Results: Lab Results  Component Value Date   WBC 6.6 04/19/2020   HGB 15.0 04/19/2020   HCT 45.2 04/19/2020   MCV 88.3 04/19/2020   PLT 223 04/19/2020   Lab Results  Component Value Date   INR 0.9 04/19/2020   BMET Lab Results  Component Value Date   NA 142 04/19/2020   K 3.3 (L) 04/19/2020   CL 105 04/19/2020   CO2 26 04/19/2020   GLUCOSE 96 04/19/2020   BUN 12 04/19/2020   CREATININE 0.74 04/19/2020   CALCIUM 10.0 04/19/2020    Studies/Results: DG Lumbar Spine 2-3 Views  Result Date: 04/21/2020 CLINICAL DATA:  Lumbar fusion EXAM: LUMBAR SPINE - 2-3 VIEW; DG C-ARM 1-60 MIN COMPARISON:  None. FLUOROSCOPY TIME:  Radiation Exposure Index (as provided by the fluoroscopic device): 120.67 mGy If the device does not provide the exposure index: Fluoroscopy Time:  2 minutes 1 second Number of Acquired Images:  2 FINDINGS: Changes consistent with fusion at L3-4, L4-5 and L5-S1 are noted. Pedicle screws are noted at all 4 levels with posterior fixation. IMPRESSION: Lumbar fusion from L3-S1. Electronically Signed   By: Inez Catalina M.D.   On: 04/21/2020 16:59   DG C-Arm 1-60 Min  Result Date: 04/21/2020 CLINICAL DATA:  Lumbar fusion EXAM: LUMBAR SPINE - 2-3 VIEW; DG C-ARM 1-60 MIN COMPARISON:  None. FLUOROSCOPY TIME:  Radiation Exposure Index (as provided by  the fluoroscopic device): 120.67 mGy If the device does not provide the exposure index: Fluoroscopy Time:  2 minutes 1 second Number of Acquired Images:  2 FINDINGS: Changes consistent with fusion at L3-4, L4-5 and L5-S1 are noted. Pedicle screws are noted at all 4 levels with posterior fixation. IMPRESSION: Lumbar fusion from L3-S1. Electronically Signed   By: Inez Catalina M.D.   On: 04/21/2020 16:59    Assessment/Plan: Postop day 1 3 level lumbar fusion. Doing well, still has appropriate back pain. Therapy today and pain management. Possibly home tomorrow.    LOS: 1 day    Ocie Cornfield Aowyn Rozeboom 04/22/2020, 8:03 AM

## 2020-04-22 NOTE — Anesthesia Postprocedure Evaluation (Signed)
Anesthesia Post Note  Patient: ALEESE KAMPS  Procedure(s) Performed: Posterior Lumbar interbody Fusion - Lumbar three-Lumbar four - Lumbar four-Lumbar five - Lumbar five-Sacral one (N/A Back)     Patient location during evaluation: PACU Anesthesia Type: General Level of consciousness: awake and alert Pain management: pain level controlled Vital Signs Assessment: post-procedure vital signs reviewed and stable Respiratory status: spontaneous breathing, nonlabored ventilation, respiratory function stable and patient connected to nasal cannula oxygen Cardiovascular status: blood pressure returned to baseline and stable Postop Assessment: no apparent nausea or vomiting Anesthetic complications: no   No complications documented.  Last Vitals:  Vitals:   04/21/20 2335 04/22/20 0436  BP: (!) 101/59 122/69  Pulse: 80 77  Resp: 20 20  Temp: 36.9 C 36.9 C  SpO2: 94% 96%    Last Pain:  Vitals:   04/22/20 0619  TempSrc:   PainSc: [place]

## 2020-04-22 NOTE — Evaluation (Signed)
Physical Therapy Evaluation Patient Details Name: Lauren Carter MRN: [id number] DOB: Jan 25, 1951 Today's Date: 04/22/2020   History of Present Illness  This 70 yo female s/p decompressive lumbar laminectomy hemifacetectomy foraminotomies L3-S1,Posterior lumbar interbody fusion L3-S1 on 04/21/2020.    Clinical Impression  Pt admitted with above diagnosis. At the time of PT eval, pt was able to demonstrate transfers and ambulation with gross min guard assist to supervision for safety and no AD. Pt was educated on precautions, brace application/wearing schedule, appropriate activity progression, and car transfer. Pt currently with functional limitations due to the deficits listed below (see PT Problem List). Pt will benefit from skilled PT to increase their independence and safety with mobility to allow discharge to the venue listed below.      Follow Up Recommendations No PT follow up;Supervision for mobility/OOB    Equipment Recommendations  None recommended by PT    Recommendations for Other Services       Precautions / Restrictions Precautions Precautions: Back Precaution Booklet Issued: Yes (comment) Precaution Comments: drain Required Braces or Orthoses: Spinal Brace Spinal Brace: Lumbar corset;Applied in sitting position Restrictions Weight Bearing Restrictions: No      Mobility  Bed Mobility Overal bed mobility: Needs Assistance Bed Mobility: Rolling;Sidelying to Sit Rolling: Supervision Sidelying to sit: Supervision;HOB elevated       General bed mobility comments: Pt was received sitting up in the chair.    Transfers Overall transfer level: Needs assistance Equipment used: None Transfers: Sit to/from Stand Sit to Stand: Supervision         General transfer comment: Pt slow and guarded due to pain but overall was able to manage transfers with good hand placement on seated surface for safety.  Ambulation/Gait Ambulation/Gait assistance: Min  guard;Supervision Gait Distance (Feet): 200 Feet Assistive device: None Gait Pattern/deviations: Step-through pattern;Decreased stride length;Trunk flexed Gait velocity: Decreased Gait velocity interpretation: <1.31 ft/sec, indicative of household ambulator General Gait Details: Pt with overall good posture and steady gait. Pt limited and guarded due to pain - tearful at times. Pt motivated to try stair training, however therapist deferred due to reported 9/10 pain.  Stairs            Wheelchair Mobility    Modified Rankin (Stroke Patients Only)       Balance Overall balance assessment: Needs assistance Sitting-balance support: No upper extremity supported;Feet supported Sitting balance-Leahy Scale: Good     Standing balance support: No upper extremity supported;During functional activity Standing balance-Leahy Scale: Fair Standing balance comment: standing at sink to wash hands and brush teeth                             Pertinent Vitals/Pain Pain Assessment: 0-10 Pain Score: 9  Pain Location: incisional, R hip/groin/quad Pain Descriptors / Indicators: Aching;Sore;Grimacing;Guarding Pain Intervention(s): Limited activity within patient's tolerance;Monitored during session;Repositioned;Ice applied    Home Living Family/patient expects to be discharged to:: Private residence Living Arrangements: Spouse/significant other Available Help at Discharge: Family;Available 24 hours/day Type of Home: House Home Access: Stairs to enter Entrance Stairs-Rails: Right Entrance Stairs-Number of Steps: 4 Home Layout: Multi-level;Bed/bath upstairs Home Equipment: Grab bars - tub/shower;Hand held shower head;Grab bars - toilet;Shower seat - built in      Prior Function Level of Independence: Independent               Hand Dominance   Dominant Hand: Right    Extremity/Trunk Assessment   Upper Extremity Assessment  Upper Extremity Assessment: Defer to OT  evaluation    Lower Extremity Assessment Lower Extremity Assessment: Generalized weakness (Consistent with pre-op diagnosis)    Cervical / Trunk Assessment Cervical / Trunk Assessment: Other exceptions Cervical / Trunk Exceptions: s/p surgery  Communication   Communication: No difficulties  Cognition Arousal/Alertness: Awake/alert Behavior During Therapy: WFL for tasks assessed/performed Overall Cognitive Status: Within Functional Limits for tasks assessed                                        General Comments      Exercises     Assessment/Plan    PT Assessment Patient needs continued PT services  PT Problem List Decreased strength;Decreased activity tolerance;Decreased balance;Decreased mobility;Decreased knowledge of use of DME;Decreased knowledge of precautions;Decreased safety awareness;Pain       PT Treatment Interventions DME instruction;Gait training;Stair training;Functional mobility training;Therapeutic activities;Therapeutic exercise;Neuromuscular re-education;Patient/family education    PT Goals (Current goals can be found in the Care Plan section)  Acute Rehab PT Goals Patient Stated Goal: Increased pain control PT Goal Formulation: With patient Time For Goal Achievement: 04/29/20 Potential to Achieve Goals: Good    Frequency Min 5X/week   Barriers to discharge        Co-evaluation               AM-PAC PT "6 Clicks" Mobility  Outcome Measure Help needed turning from your back to your side while in a flat bed without using bedrails?: None Help needed moving from lying on your back to sitting on the side of a flat bed without using bedrails?: None Help needed moving to and from a bed to a chair (including a wheelchair)?: A Little Help needed standing up from a chair using your arms (e.g., wheelchair or bedside chair)?: A Little Help needed to walk in hospital room?: A Little Help needed climbing 3-5 steps with a railing? : A  Little 6 Click Score: 20    End of Session Equipment Utilized During Treatment: Gait belt;Back brace Activity Tolerance: Patient limited by pain Patient left: in chair;with call bell/phone within reach Nurse Communication: Mobility status PT Visit Diagnosis: Unsteadiness on feet (R26.81);Pain Pain - part of body:  (back, R hip)    Time: 6599-3570 PT Time Calculation (min) (ACUTE ONLY): 17 min   Charges:   PT Evaluation $PT Eval Low Complexity: 1 Low          Lauren Carter, PT, DPT Acute Rehabilitation Services Pager: [phone] Office: [phone]   Thelma Comp 04/22/2020, 12:13 PM

## 2020-04-23 NOTE — Progress Notes (Signed)
She seems to be doing okay.  She has appropriate back soreness without leg pain or numbness tingling or weakness.  She is walking okay.  She still requiring IV pain medication for pain control.  Continue physical and occupational therapy.  Continue to mobilize.  Continue pain control.  Hopefully home tomorrow.

## 2020-04-23 NOTE — Progress Notes (Signed)
Physical Therapy Treatment Patient Details Name: Lauren Carter MRN: [id number] DOB: December 05, 1950 Today's Date: 04/23/2020    History of Present Illness This 70 yo female s/p decompressive lumbar laminectomy hemifacetectomy foraminotomies L3-4 L4-5 L5-S1,Posterior lumbar interbody fusion L3-4 L4-5 L5-S1. PMH: arthritis, GERD, HTN, HLD.    PT Comments    Pt received up in bathroom brushing teeth, agreeable to therapy session and with good participation and tolerance for mobility compared with previous session. Pt reports moderate to severe pain but able to initiate stair training with Supervision x10 steps and single rail, progress gait with no AD at modI level and performed bed mobility with focus on log rolling with Supervision. Pt continues to benefit from acute PT to reinforce activity pacing, compliance with back precautions and safety with functional mobility progression. Continue to recommend home, pt given HEP handout this date for LE strengthening to take home as well.    Follow Up Recommendations  No PT follow up;Supervision for mobility/OOB     Equipment Recommendations  None recommended by PT    Recommendations for Other Services       Precautions / Restrictions Precautions Precautions: Back Precaution Booklet Issued: Yes (comment) (handout in room, previously given) Precaution Comments: hemovac drain Required Braces or Orthoses: Spinal Brace Spinal Brace: Lumbar corset;Applied in sitting position Restrictions Weight Bearing Restrictions: No    Mobility  Bed Mobility Overal bed mobility: Needs Assistance Bed Mobility: Rolling;Sit to Sidelying Rolling: Supervision       Sit to sidelying: Supervision General bed mobility comments: HOB flat, no rails per home setup, verbal/tactile cues for log roll sequencing    Transfers Overall transfer level: Needs assistance Equipment used: None Transfers: Sit to/from Stand Sit to Stand: Supervision         General  transfer comment: from EOB and chair heights, slow but good safety awareness  Ambulation/Gait Ambulation/Gait assistance: Modified independent (Device/Increase time) Gait Distance (Feet): 250 Feet Assistive device: None Gait Pattern/deviations: Step-through pattern;Decreased stride length Gait velocity: Decreased   General Gait Details: Pt with overall good posture and steady gait. Pt with improved pain tolerance and fair cadence, no LOB; VSS on RA   Stairs Stairs: Yes Stairs assistance: Supervision Stair Management: One rail Right;Alternating pattern;Step to pattern;Forwards Number of Stairs: 10 General stair comments: cues for step-to gait pattern due to increased R hip pain, pt able to tolerate reciprocal gait pattern to ascend but more comfortable with step-to gait pattern when descending; R rail ascending/L rail descending per home setup, no LOB   Wheelchair Mobility    Modified Rankin (Stroke Patients Only)       Balance Overall balance assessment: Needs assistance Sitting-balance support: No upper extremity supported;Feet supported Sitting balance-Leahy Scale: Good     Standing balance support: No upper extremity supported;During functional activity Standing balance-Leahy Scale: Good Standing balance comment: may try tandem stance assessment next session? Pt without LOB for ambulation in hallway/stairs with no AD                            Cognition Arousal/Alertness: Awake/alert Behavior During Therapy: WFL for tasks assessed/performed Overall Cognitive Status: Within Functional Limits for tasks assessed                                 General Comments: pleasant, cooperative, motivated; upon staff entrance, pt up brushing teeth at sink but lumber brace not donned,  reinforced importance of donning brace seated EOB prior to mobility to reinforce back precautions/for stability post-op      Exercises General Exercises - Lower  Extremity Ankle Circles/Pumps: AROM;Both;10 reps;Seated Long Arc Quad: AROM;Both;5 reps;Seated Hip Flexion/Marching: AROM;Both;10 reps;Standing (with support of raised bed rail) Mini-Sqauts: AROM;Both;5 reps;Standing Other Exercises Other Exercises: standing BLE AROM: hamstring curls, hip abduction x5-10 reps ea, HEP handout given to reinforce proper posture/technique during therex ([email or web address] Access Code: [email or web address])    General Comments General comments (skin integrity, edema, etc.): back incision C/D/I, still has hemovac drain at time of session, ice applied in semi-sidelying at end of session for pain relief      Pertinent Vitals/Pain Pain Assessment: 0-10 Pain Score: 7  Pain Location: incisional, R hip/groin/quad Pain Descriptors / Indicators: Aching;Sore;Grimacing;Guarding Pain Intervention(s): Monitored during session;Repositioned;Ice applied    Home Living                      Prior Function            PT Goals (current goals can now be found in the care plan section) Acute Rehab PT Goals Patient Stated Goal: Increased pain control and hopefully home in the next day PT Goal Formulation: With patient Time For Goal Achievement: 04/29/20 Potential to Achieve Goals: Good Progress towards PT goals: Progressing toward goals    Frequency    Min 5X/week      PT Plan Current plan remains appropriate    Co-evaluation              AM-PAC PT "6 Clicks" Mobility   Outcome Measure  Help needed turning from your back to your side while in a flat bed without using bedrails?: None Help needed moving from lying on your back to sitting on the side of a flat bed without using bedrails?: A Little Help needed moving to and from a bed to a chair (including a wheelchair)?: A Little Help needed standing up from a chair using your arms (e.g., wheelchair or bedside chair)?: A Little Help needed to walk in hospital room?: None Help needed climbing 3-5  steps with a railing? : A Little 6 Click Score: 20    End of Session Equipment Utilized During Treatment: Gait belt;Back brace Activity Tolerance: Patient tolerated treatment well Patient left: with call bell/phone within reach;in bed (ice pack behind R back) Nurse Communication: Mobility status PT Visit Diagnosis: Unsteadiness on feet (R26.81);Pain Pain - part of body:  (back, R hip)     Time: 6378-5885 PT Time Calculation (min) (ACUTE ONLY): 23 min  Charges:  $Gait Training: 8-22 mins $Therapeutic Exercise: 8-22 mins                     Iyesha Such P., PTA Acute Rehabilitation Services Pager: [phone] Office: [place] 04/23/2020, 12:00 PM

## 2020-04-24 MED ORDER — OXYCODONE HCL 10 MG PO TABS: 10.0000 mg | ORAL_TABLET | Freq: Four times a day (QID) | ORAL | 0 refills | Status: DC | PRN

## 2020-04-24 MED ORDER — HYDROCODONE-ACETAMINOPHEN 10-325 MG PO TABS: 1.0000 | ORAL_TABLET | Freq: Four times a day (QID) | ORAL | 0 refills | Status: AC | PRN

## 2020-04-24 MED ORDER — METHOCARBAMOL 500 MG PO TABS: 500.0000 mg | ORAL_TABLET | Freq: Four times a day (QID) | ORAL | 0 refills | Status: DC | PRN

## 2020-04-24 NOTE — Discharge Summary (Addendum)
Physician Discharge Summary  Patient ID: NISHAT LIVINGSTON MRN: [id number] DOB/AGE: [birth year] 70 y.o.  Admit date: 04/21/2020 Discharge date: 04/24/2020  Admission Diagnoses:  Lumbar spondylolisthesis  Discharge Diagnoses:  Same Active Problems:   S/P lumbar fusion   Discharged Condition: Stable  Hospital Course:  Lauren Carter is a 70 y.o. female admitted after elective L3-S1 fusion. She reported significant improvement in back and hip/leg pain, was ambulating well, tolerating diet and voiding normally. She was therefore discharged in stable condition.   Treatments: Surgery - L3-S1 PLIF  Discharge Exam: Blood pressure 111/62, pulse 63, temperature (!) 97.5 F (36.4 C), temperature source Oral, resp. rate 18, height 5\' 4"  (1.626 m), weight 91.1 kg, SpO2 99 %. Awake, alert, oriented Speech fluent, appropriate CN grossly intact 5/5 BUE/BLE Wound c/d/i  Disposition: Discharge disposition: 01-Home or Self Care       Discharge Instructions    Call MD for:  redness, tenderness, or signs of infection (pain, swelling, redness, odor or green/yellow discharge around incision site)   Complete by: As directed    Call MD for:  temperature >100.4   Complete by: As directed    Diet - low sodium heart healthy   Complete by: As directed    Discharge instructions   Complete by: As directed    Walk at home as much as possible, at least 4 times / day   Increase activity slowly   Complete by: As directed    Lifting restrictions   Complete by: As directed    No lifting > 10 lbs   May shower / Bathe   Complete by: As directed    48 hours after surgery   May walk up steps   Complete by: As directed    Other Restrictions   Complete by: As directed    No bending/twisting at waist   Remove dressing in 48 hours   Complete by: As directed      Allergies as of 04/24/2020      Reactions   Ibuprofen Other (See Comments)   Causes GI upset      Medication List    STOP taking  these medications   oxycodone 5 MG capsule Commonly known as: OXY-IR     TAKE these medications   aspirin EC 81 MG tablet Take 81 mg by mouth daily.   CALCIUM 600 + D PO Take 2 tablets by mouth daily.   CENTRUM SILVER 50+WOMEN PO Take 1 tablet by mouth daily.   cetirizine 10 MG tablet Commonly known as: ZYRTEC Take 10 mg by mouth daily.   docusate sodium 100 MG capsule Commonly known as: COLACE Take 1 capsule (100 mg total) by mouth 2 (two) times daily. To keep stools soft   gabapentin 800 MG tablet Commonly known as: Neurontin Take 1 tablet (800 mg total) by mouth at bedtime for 14 days. Take nightly for 3 days, then up to 14 days as needed   GLUCOSAMINE-MSM PO Take 2 tablets by mouth daily.   hydrochlorothiazide 25 MG tablet Commonly known as: HYDRODIURIL Take 25 mg by mouth daily.   HYDROcodone-acetaminophen 10-325 MG tablet Commonly known as: NORCO Take 1-2 tablets by mouth every 6 (six) hours as needed for up to 7 days for severe pain.   losartan 100 MG tablet Commonly known as: COZAAR Take 100 mg by mouth at bedtime.   LUTEIN-ZEAXANTHIN PO Take 1 capsule by mouth daily.   methocarbamol 500 MG tablet Commonly known as: ROBAXIN Take 1 tablet (  500 mg total) by mouth every 6 (six) hours as needed for muscle spasms.   omeprazole 20 MG capsule Commonly known as: PRILOSEC Take 20 mg by mouth daily.   PARoxetine 20 MG tablet Commonly known as: PAXIL Take 20 mg by mouth every evening.   pravastatin 20 MG tablet Commonly known as: PRAVACHOL Take 20 mg by mouth at bedtime.   SUPER B COMPLEX PO Take 1 capsule by mouth daily.   Vitamin D 50 MCG (2000 UT) tablet Take 2,000 Units by mouth daily.            Durable Medical Equipment  (From admission, onward)         Start     Ordered   04/21/20 2125  DME Walker rolling  Once       Question:  Patient needs a walker to treat with the following condition  Answer:  S/P lumbar fusion   04/21/20 2124    04/21/20 2125  DME 3 n 1  Once        04/21/20 2124          Follow-up Information    Eustace Moore, MD Follow up.   Specialty: Neurosurgery Contact information: [address] [place] [address] [phone]               Signed: Jairo Ben 04/24/2020, 10:11 AM

## 2020-04-24 NOTE — Plan of Care (Signed)
Patient alert and oriented, voiding adequately, MAE well with no difficulty. Incision area cdi with no s/s of infection. Patient discharged home per order. Patient and husband stated understanding of discharge instructions given. Patient has an appointment with Dr. Jones 

## 2020-04-24 NOTE — Discharge Instructions (Signed)
Wound Care Keep incision covered and dry for two days.   Do not put any creams, lotions, or ointments on incision. Leave steri-strips on back.  They will fall off by themselves. Activity Walk each and every day, increasing distance each day. No lifting greater than 5 lbs.  Avoid excessive neck motion. No driving for 2 weeks; may ride as a passenger locally. If provided with back brace, wear when out of bed.  It is not necessary to wear brace in bed. Diet Resume your normal diet.   Call Your Doctor If Any of These Occur Redness, drainage, or swelling at the wound.  Temperature greater than 101 degrees. Severe pain not relieved by pain medication. Incision starts to come apart. Follow Up Appt Call today for appointment in 1-2 weeks ([phone]) or for problems.  If you have any hardware placed in your spine, you will need an x-ray before your appointment.

## 2020-04-24 NOTE — Progress Notes (Signed)
Physical Therapy Treatment Patient Details Name: Lauren Carter MRN: [id number] DOB: 01-Dec-1950 Today's Date: 04/24/2020    History of Present Illness This 70 yo female s/p decompressive lumbar laminectomy hemifacetectomy foraminotomies L3-4 L4-5 L5-S1,Posterior lumbar interbody fusion L3-4 L4-5 L5-S1. PMH: arthritis, GERD, HTN, HLD.    PT Comments    Patient progressing well towards physical therapy goals. Patient overall modI for mobility with no AD. Reinforced back precautions with patient able to recall 3/3 at beginning of session. Encouraged use of cane for balance as patient seems unsteady and reaching out for objects. No PT follow up recommended at this time. No further PT needs. Goals met and education complete.   Follow Up Recommendations  No PT follow up;Supervision for mobility/OOB     Equipment Recommendations  None recommended by PT    Recommendations for Other Services       Precautions / Restrictions Precautions Precautions: Back Precaution Booklet Issued: Yes (comment) Required Braces or Orthoses: Spinal Brace Spinal Brace: Lumbar corset;Applied in sitting position Restrictions Weight Bearing Restrictions: No    Mobility  Bed Mobility               General bed mobility comments: up in recliner on arrival    Transfers Overall transfer level: Needs assistance Equipment used: None Transfers: Sit to/from Stand Sit to Stand: Modified independent (Device/Increase time)            Ambulation/Gait Ambulation/Gait assistance: Modified independent (Device/Increase time) Gait Distance (Feet): 300 Feet Assistive device: None Gait Pattern/deviations: Step-through pattern;Decreased stride length Gait velocity: Decreased   General Gait Details: patient reaching for objects as she walked for support, educated patient on use of cane and husband being present with ambulation when returning home.   Stairs Stairs: Yes Stairs assistance: Modified  independent (Device/Increase time) Stair Management: One rail Right;Alternating pattern;Forwards;Step to pattern Number of Stairs: 10 General stair comments: reciprocal pattern for ascent and step to for descent   Wheelchair Mobility    Modified Rankin (Stroke Patients Only)       Balance Overall balance assessment: Mild deficits observed, not formally tested                                          Cognition Arousal/Alertness: Awake/alert Behavior During Therapy: WFL for tasks assessed/performed Overall Cognitive Status: Within Functional Limits for tasks assessed                                        Exercises      General Comments        Pertinent Vitals/Pain Pain Assessment: Faces Faces Pain Scale: Hurts a little bit Pain Location: incisional, R hip/groin/quad Pain Descriptors / Indicators: Aching;Sore;Grimacing;Guarding Pain Intervention(s): Monitored during session    Home Living                      Prior Function            PT Goals (current goals can now be found in the care plan section) Acute Rehab PT Goals Patient Stated Goal: to get better PT Goal Formulation: With patient Time For Goal Achievement: 04/29/20 Potential to Achieve Goals: Good Progress towards PT goals: Progressing toward goals    Frequency    Min 5X/week  PT Plan Current plan remains appropriate    Co-evaluation              AM-PAC PT "6 Clicks" Mobility   Outcome Measure  Help needed turning from your back to your side while in a flat bed without using bedrails?: None Help needed moving from lying on your back to sitting on the side of a flat bed without using bedrails?: None Help needed moving to and from a bed to a chair (including a wheelchair)?: None Help needed standing up from a chair using your arms (e.g., wheelchair or bedside chair)?: None Help needed to walk in hospital room?: None Help needed climbing  3-5 steps with a railing? : None 6 Click Score: 24    End of Session Equipment Utilized During Treatment: Back brace Activity Tolerance: Patient tolerated treatment well Patient left: in chair;with call bell/phone within reach Nurse Communication: Mobility status PT Visit Diagnosis: Unsteadiness on feet (R26.81);Pain     Time: 3584-4652 PT Time Calculation (min) (ACUTE ONLY): 14 min  Charges:  $Therapeutic Activity: 8-22 mins                     Lauren Carter PT, DPT Acute Rehabilitation Services Pager [phone] Office [phone]    [name] 04/24/2020, 9:40 AM

## 2021-03-17 ENCOUNTER — Other Ambulatory Visit: Payer: Self-pay | Admitting: Family Medicine

## 2021-03-17 DIAGNOSIS — Z1231 Encounter for screening mammogram for malignant neoplasm of breast: Secondary | ICD-10-CM

## 2021-05-31 ENCOUNTER — Ambulatory Visit
Admission: RE | Admit: 2021-05-31 | Discharge: 2021-05-31 | Disposition: A | Discharge status: Home / Self Care | Payer: Medicare PPO | Source: Ambulatory Visit | Attending: Family Medicine | Admitting: Family Medicine

## 2021-05-31 DIAGNOSIS — Z1231 Encounter for screening mammogram for malignant neoplasm of breast: Secondary | ICD-10-CM | POA: Diagnosis present

## 2021-11-08 ENCOUNTER — Other Ambulatory Visit: Payer: Self-pay | Admitting: Student

## 2021-11-08 DIAGNOSIS — M461 Sacroiliitis, not elsewhere classified: Secondary | ICD-10-CM

## 2021-11-16 ENCOUNTER — Ambulatory Visit
Admission: RE | Admit: 2021-11-16 | Discharge: 2021-11-16 | Disposition: A | Discharge status: Home / Self Care | Payer: Medicare PPO | Source: Ambulatory Visit | Attending: Student | Admitting: Student

## 2021-11-16 DIAGNOSIS — M461 Sacroiliitis, not elsewhere classified: Secondary | ICD-10-CM

## 2022-05-02 ENCOUNTER — Other Ambulatory Visit: Payer: Self-pay | Admitting: Family Medicine

## 2022-05-02 DIAGNOSIS — Z1231 Encounter for screening mammogram for malignant neoplasm of breast: Secondary | ICD-10-CM

## 2022-06-07 ENCOUNTER — Ambulatory Visit
Admission: RE | Admit: 2022-06-07 | Discharge: 2022-06-07 | Disposition: A | Discharge status: Home / Self Care | Payer: Medicare PPO | Source: Ambulatory Visit | Attending: Family Medicine | Admitting: Family Medicine

## 2022-06-07 DIAGNOSIS — Z1231 Encounter for screening mammogram for malignant neoplasm of breast: Secondary | ICD-10-CM | POA: Diagnosis present

## 2022-10-04 ENCOUNTER — Ambulatory Visit: Payer: Medicare PPO | Admitting: Nurse Practitioner

## 2022-10-04 ENCOUNTER — Encounter: Payer: Self-pay | Admitting: Nurse Practitioner

## 2022-10-04 VITALS — BP 124/80 | HR 69 | Temp 97.9°F | Ht 63.25 in | Wt 209.0 lb

## 2022-10-04 DIAGNOSIS — I10 Essential (primary) hypertension: Secondary | ICD-10-CM | POA: Diagnosis not present

## 2022-10-04 DIAGNOSIS — E669 Obesity, unspecified: Secondary | ICD-10-CM

## 2022-10-04 DIAGNOSIS — R591 Generalized enlarged lymph nodes: Secondary | ICD-10-CM | POA: Insufficient documentation

## 2022-10-04 DIAGNOSIS — Z6836 Body mass index (BMI) 36.0-36.9, adult: Secondary | ICD-10-CM | POA: Diagnosis not present

## 2022-10-04 DIAGNOSIS — F411 Generalized anxiety disorder: Secondary | ICD-10-CM

## 2022-10-04 DIAGNOSIS — H9201 Otalgia, right ear: Secondary | ICD-10-CM | POA: Diagnosis not present

## 2022-10-04 DIAGNOSIS — E785 Hyperlipidemia, unspecified: Secondary | ICD-10-CM

## 2022-10-04 DIAGNOSIS — Z981 Arthrodesis status: Secondary | ICD-10-CM | POA: Diagnosis not present

## 2022-10-04 DIAGNOSIS — Z7689 Persons encountering health services in other specified circumstances: Secondary | ICD-10-CM

## 2022-10-04 DIAGNOSIS — Z0000 Encounter for general adult medical examination without abnormal findings: Secondary | ICD-10-CM | POA: Insufficient documentation

## 2022-10-04 MED ORDER — FLUTICASONE PROPIONATE 50 MCG/ACT NA SUSP: 2.0000 | Freq: Every day | NASAL | 0 refills | Status: DC

## 2022-10-04 NOTE — Assessment & Plan Note (Signed)
Been going on 3 weeks.  Has not went down no obvious signs of reactive lymphadenopathy ultrasound placed today.  Information given to patient to call and schedule

## 2022-10-04 NOTE — Assessment & Plan Note (Signed)
Patient currently maintained losartan 100 mg and hydrochlorothiazide 25 mg daily.  Tolerates medication well.  Checks blood pressures at home blood pressure within normal limits here today.  Continue medication as prescribed

## 2022-10-04 NOTE — Assessment & Plan Note (Signed)
Did review both recent PCP note from [facility] internal medicine and most recent labs

## 2022-10-04 NOTE — Assessment & Plan Note (Signed)
Patient already on second-generation histamine like no particular nasal spray 50 mcg per actuation.  Continue over-the-counter analgesics as needed

## 2022-10-04 NOTE — Assessment & Plan Note (Signed)
Patient currently maintained on duloxetine 30 mg.  Let patient take it at nighttime see if this makes the "out of the sensation" go away

## 2022-10-04 NOTE — Progress Notes (Signed)
Established Patient Office Visit  Subjective   Patient ID: Lauren Carter, female    DOB: 10/13/1950  Age: 72 y.o. MRN: [id number]  Chief Complaint  Patient presents with   Establish Care   Temporomandibular Joint Pain    Pt states of lymph node on right side of neck. States she believes it is TMJ. Swelling and pain level 5 today.     HPI  Transfer of care: was a patient of Marisue Ivan, MD  Last seen on 09/19/2022  HLD: Patient maintained on pravastatin nightly and tolerates medication well per her report.  HTN: losartan and hydrochlorothiazide. Takes her blood pressure twice a day. States tolerates the medication well.  She will take losartan at night and hydrochlorothiazide in the morning  GAD: duloxetine 30.  Patient states sometimes after taking this medication the morning she feels "out of it" it will pass does not every morning.  Patient denies HI/SI/AVH.  Lymphadenoapathy: right sided states that it has been 3 plus weeks. States that it has stayed the same in size States that she is having some neck pain with swelling. States that she has tired tylenol. Ice. Heat and jaw exercise. States that worse with jaw movement and feels like the ear needs to pop  Tdap: 2020 Flu: Competed this season Covid: utd Shingles: utd PNA: Up-to-date  Colonscopy: 03/02024 Dr Dulce Sellar with [facility] Sprang   Mammogram: Done 06/07/2022 within normal limits repeat 1 year  Pap smear: Aged out and hysterectomy  Dexa: within 2 years    Review of Systems  Constitutional:  Negative for chills and fever.  Respiratory:  Negative for shortness of breath.   Cardiovascular:  Negative for chest pain and leg swelling.  Gastrointestinal:  Negative for abdominal pain, blood in stool, constipation, diarrhea, nausea and vomiting.       Bm daily   Genitourinary:  Negative for dysuria and hematuria.  Neurological:  Positive for headaches. Negative for tingling.  Psychiatric/Behavioral:  Negative for  hallucinations and suicidal ideas.       Objective:     BP 124/80   Pulse 69   Temp 97.9 F (36.6 C) (Temporal)   Ht 5' 3.25" (1.607 m)   Wt 209 lb (94.8 kg)   SpO2 92%   BMI 36.73 kg/m    Physical Exam Vitals and nursing note reviewed.  Constitutional:      Appearance: Normal appearance.  HENT:     Right Ear: Tympanic membrane, ear canal and external ear normal.     Left Ear: Tympanic membrane, ear canal and external ear normal.     Mouth/Throat:     Mouth: Mucous membranes are moist.     Dentition: Dental caries present.     Pharynx: Oropharynx is clear.  Eyes:     Extraocular Movements: Extraocular movements intact.     Pupils: Pupils are equal, round, and reactive to light.  Cardiovascular:     Rate and Rhythm: Normal rate and regular rhythm.     Pulses: Normal pulses.     Heart sounds: Normal heart sounds.  Pulmonary:     Effort: Pulmonary effort is normal.     Breath sounds: Normal breath sounds.  Musculoskeletal:     Right lower leg: No edema.     Left lower leg: No edema.  Lymphadenopathy:     Cervical: Cervical adenopathy present.  Skin:    General: Skin is warm.  Neurological:     General: No focal deficit present.  Mental Status: She is alert.     Deep Tendon Reflexes:     Reflex Scores:      Bicep reflexes are 2+ on the right side and 2+ on the left side.      Patellar reflexes are 2+ on the right side and 2+ on the left side.    Comments: Bilateral upper and lower extremity strength 5/5  Psychiatric:        Mood and Affect: Mood normal.        Behavior: Behavior normal.        Thought Content: Thought content normal.        Judgment: Judgment normal.      No results found for any visits on 10/04/22.    The ASCVD Risk score (Arnett DK, et al., 2019) failed to calculate for the following reasons:   Cannot find a previous HDL lab   Cannot find a previous total cholesterol lab    Assessment & Plan:   Problem List Items Addressed  This Visit       Cardiovascular and Mediastinum   Primary hypertension    Patient currently maintained losartan 100 mg and hydrochlorothiazide 25 mg daily.  Tolerates medication well.  Checks blood pressures at home blood pressure within normal limits here today.  Continue medication as prescribed        Immune and Lymphatic   Lymphadenopathy    Been going on 3 weeks.  Has not went down no obvious signs of reactive lymphadenopathy ultrasound placed today.  Information given to patient to call and schedule      Relevant Orders   US Soft Tissue Head/Neck (NON-THYROID)     Other   S/P lumbar fusion   Establishing care with new doctor, encounter for - Primary    Did review both recent PCP note from [facility] internal medicine and most recent labs      Right ear pain    Patient already on second-generation histamine like no particular nasal spray 50 mcg per actuation.  Continue over-the-counter analgesics as needed      Relevant Medications   fluticasone (FLONASE) 50 MCG/ACT nasal spray   Hyperlipidemia    Patient: Pravastatin 20 mg.  Did review most recent labs continue medication as prescribed      GAD (generalized anxiety disorder)    Patient currently maintained on duloxetine 30 mg.  Let patient take it at nighttime see if this makes the "out of the sensation" go away      Relevant Medications   DULoxetine (CYMBALTA) 30 MG capsule   Other Visit Diagnoses     Obesity (BMI 30-39.9)           Return in about 6 months (around 04/06/2023) for HLD, HTN recheck .    Audria Nine, NP

## 2022-10-04 NOTE — Patient Instructions (Signed)
Nice to see you today I will be in touch with the Ultrasound results once I have gotten them Follow up with me in 6 months, sooner if you need me

## 2022-10-04 NOTE — Assessment & Plan Note (Signed)
Patient: Pravastatin 20 mg.  Did review most recent labs continue medication as prescribed

## 2022-10-05 ENCOUNTER — Other Ambulatory Visit: Payer: Self-pay | Admitting: Nurse Practitioner

## 2022-10-06 NOTE — Telephone Encounter (Signed)
LAST APPOINTMENT DATE: 10/04/2022   NEXT APPOINTMENT DATE: 04/09/23   PRAVASTATIN 20mg   LAST REFILL: 07/10/22  QTY: unknown 0RF

## 2022-10-09 ENCOUNTER — Ambulatory Visit
Admission: RE | Admit: 2022-10-09 | Discharge: 2022-10-09 | Disposition: A | Discharge status: Home / Self Care | Payer: Medicare PPO | Source: Ambulatory Visit | Attending: Nurse Practitioner | Admitting: Nurse Practitioner

## 2022-10-09 DIAGNOSIS — R599 Enlarged lymph nodes, unspecified: Secondary | ICD-10-CM | POA: Diagnosis not present

## 2022-10-09 DIAGNOSIS — R591 Generalized enlarged lymph nodes: Secondary | ICD-10-CM

## 2022-11-01 ENCOUNTER — Other Ambulatory Visit: Payer: Self-pay | Admitting: Nurse Practitioner

## 2022-11-01 NOTE — Telephone Encounter (Signed)
Prescription Request  11/01/2022  LOV: 10/04/2022  What is the name of the medication or equipment? hydrochlorothiazide (HYDRODIURIL) 25 MG tablet & DULoxetine (CYMBALTA) 30 MG capsule   Have you contacted your pharmacy to request a refill? No   Which pharmacy would you like this sent to?    [id number] - [place], [place] - [address] [place] [address] Phone: [phone] Fax: [phone]    Patient notified that their request is being sent to the clinical staff for review and that they should receive a response within 2 business days.   Please advise at Mobile [phone] (mobile)'

## 2022-11-02 NOTE — Telephone Encounter (Signed)
Do not see where given in the past by our office. Ok to refill as pended.

## 2022-11-02 NOTE — Addendum Note (Signed)
Addended by: Donnamarie Poag on: 11/02/2022 10:31 AM   Modules accepted: Orders

## 2022-11-03 MED ORDER — DULOXETINE HCL 30 MG PO CPEP: 30.0000 mg | ORAL_CAPSULE | Freq: Every day | ORAL | 1 refills | Status: DC

## 2022-11-03 MED ORDER — HYDROCHLOROTHIAZIDE 25 MG PO TABS: 25.0000 mg | ORAL_TABLET | Freq: Every day | ORAL | 1 refills | Status: DC

## 2022-12-14 DIAGNOSIS — M5416 Radiculopathy, lumbar region: Secondary | ICD-10-CM | POA: Diagnosis not present

## 2022-12-14 DIAGNOSIS — Z6836 Body mass index (BMI) 36.0-36.9, adult: Secondary | ICD-10-CM | POA: Diagnosis not present

## 2022-12-22 ENCOUNTER — Telehealth: Payer: Self-pay | Admitting: Nurse Practitioner

## 2022-12-22 MED ORDER — LOSARTAN POTASSIUM 100 MG PO TABS: 100.0000 mg | ORAL_TABLET | Freq: Every day | ORAL | 1 refills | Status: DC

## 2022-12-22 NOTE — Addendum Note (Signed)
Addended by: Eden Emms on: 12/22/2022 01:55 PM   Modules accepted: Orders

## 2022-12-22 NOTE — Telephone Encounter (Signed)
Prescription Request  12/22/2022  LOV: 10/04/2022  What is the name of the medication or equipment? losartan (COZAAR) 100 MG tablet   Have you contacted your pharmacy to request a refill? Yes   Which pharmacy would you like this sent to?   [id number] - [place], [place] - [address] [place] [address] Phone: [phone] Fax: [phone]    Patient notified that their request is being sent to the clinical staff for review and that they should receive a response within 2 business days.   Please advise at Mobile [phone] (mobile)

## 2022-12-22 NOTE — Telephone Encounter (Signed)
Refill provided

## 2022-12-27 ENCOUNTER — Other Ambulatory Visit: Payer: Self-pay

## 2022-12-27 NOTE — Telephone Encounter (Signed)
Received refill request for losartan. Contacted pharmacy. Pharmacy staff stated Losartan was filled 11/1 and Pt has picked up medication.

## 2023-01-03 ENCOUNTER — Other Ambulatory Visit: Payer: Self-pay

## 2023-01-03 DIAGNOSIS — M48061 Spinal stenosis, lumbar region without neurogenic claudication: Secondary | ICD-10-CM | POA: Diagnosis not present

## 2023-01-03 DIAGNOSIS — M545 Low back pain, unspecified: Secondary | ICD-10-CM | POA: Diagnosis not present

## 2023-01-03 DIAGNOSIS — M5116 Intervertebral disc disorders with radiculopathy, lumbar region: Secondary | ICD-10-CM | POA: Diagnosis not present

## 2023-01-03 DIAGNOSIS — R200 Anesthesia of skin: Secondary | ICD-10-CM | POA: Diagnosis not present

## 2023-01-03 DIAGNOSIS — M5416 Radiculopathy, lumbar region: Secondary | ICD-10-CM | POA: Diagnosis not present

## 2023-01-03 NOTE — Telephone Encounter (Signed)
Pt called back. Pt stated that she just picked up her prescription of losartan and does not need a refill. No further concerns or questions.

## 2023-01-03 NOTE — Telephone Encounter (Signed)
Received fax for refill request of Losartan.  The prescription was ordered on 11/1 with 1 remaining refill available.  Contacted pt to get further clarification. Call hung up while leaving voicemail for pt.  Will try again later.

## 2023-01-16 DIAGNOSIS — Z6835 Body mass index (BMI) 35.0-35.9, adult: Secondary | ICD-10-CM | POA: Diagnosis not present

## 2023-01-16 DIAGNOSIS — M5416 Radiculopathy, lumbar region: Secondary | ICD-10-CM | POA: Diagnosis not present

## 2023-01-22 ENCOUNTER — Other Ambulatory Visit: Payer: Self-pay | Admitting: Neurological Surgery

## 2023-02-06 DIAGNOSIS — M5416 Radiculopathy, lumbar region: Secondary | ICD-10-CM | POA: Diagnosis not present

## 2023-02-16 NOTE — Progress Notes (Signed)
Surgical Instructions   Your procedure is scheduled on February 28, 2023. Report to [facility] Main Entrance "A" at 10:49 A.M., then check in with the Admitting office. Any questions or running late day of surgery: call [phone]  Questions prior to your surgery date: call [phone], Monday-Friday, 8am-4pm. If you experience any cold or flu symptoms such as cough, fever, chills, shortness of breath, etc. between now and your scheduled surgery, please notify us at the above number.     Remember:  Do not eat after midnight the night before your surgery  You may drink clear liquids until 9:49 the morning of your surgery.   Clear liquids allowed are: Water, Non-Citrus Juices (without pulp), Carbonated Beverages, Clear Tea (no milk, honey, etc.), Black Coffee Only (NO MILK, CREAM OR POWDERED CREAMER of any kind), and Gatorade.    Take these medicines the morning of surgery with A SIP OF WATER  DULoxetine (CYMBALTA)  gabapentin (NEURONTIN)  loratadine (CLARITIN)  omeprazole (PRILOSEC)  pravastatin (PRAVACHOL)    May take these medicines IF NEEDED: tiZANidine (ZANAFLEX)    One week prior to surgery, STOP taking any Aspirin (unless otherwise instructed by your surgeon) Aleve, Naproxen, Ibuprofen, Motrin, Advil, Goody's, BC's, all herbal medications, fish oil, and non-prescription vitamins.                     Do NOT Smoke (Tobacco/Vaping) for 24 hours prior to your procedure.  If you use a CPAP at night, you may bring your mask/headgear for your overnight stay.   You will be asked to remove any contacts, glasses, piercing's, hearing aid's, dentures/partials prior to surgery. Please bring cases for these items if needed.    Patients discharged the day of surgery will not be allowed to drive home, and someone needs to stay with them for 24 hours.  SURGICAL WAITING ROOM VISITATION Patients may have no more than 2 support people in the waiting area - these visitors may rotate.    Pre-op nurse will coordinate an appropriate time for 1 ADULT support person, who may not rotate, to accompany patient in pre-op.  Children under the age of 74 must have an adult with them who is not the patient and must remain in the main waiting area with an adult.  If the patient needs to stay at the hospital during part of their recovery, the visitor guidelines for inpatient rooms apply.  Please refer to the [facility] website for the visitor guidelines for any additional information.   If you received a COVID test during your pre-op visit  it is requested that you wear a mask when out in public, stay away from anyone that may not be feeling well and notify your surgeon if you develop symptoms. If you have been in contact with anyone that has tested positive in the last 10 days please notify you surgeon.      Pre-operative 5 CHG Bathing Instructions   You can play a key role in reducing the risk of infection after surgery. Your skin needs to be as free of germs as possible. You can reduce the number of germs on your skin by washing with CHG (chlorhexidine gluconate) soap before surgery. CHG is an antiseptic soap that kills germs and continues to kill germs even after washing.   DO NOT use if you have an allergy to chlorhexidine/CHG or antibacterial soaps. If your skin becomes reddened or irritated, stop using the CHG and notify one of our RNs at [phone].  Please shower with the CHG soap starting 4 days before surgery using the following schedule:     Please keep in mind the following:  DO NOT shave, including legs and underarms, starting the day of your first shower.   You may shave your face at any point before/day of surgery.  Place clean sheets on your bed the day you start using CHG soap. Use a clean washcloth (not used since being washed) for each shower. DO NOT sleep with pets once you start using the CHG.   CHG Shower Instructions:  Wash your face and private area  with normal soap. If you choose to wash your hair, wash first with your normal shampoo.  After you use shampoo/soap, rinse your hair and body thoroughly to remove shampoo/soap residue.  Turn the water OFF and apply about 3 tablespoons (45 ml) of CHG soap to a CLEAN washcloth.  Apply CHG soap ONLY FROM YOUR NECK DOWN TO YOUR TOES (washing for 3-5 minutes)  DO NOT use CHG soap on face, private areas, open wounds, or sores.  Pay special attention to the area where your surgery is being performed.  If you are having back surgery, having someone wash your back for you may be helpful. Wait 2 minutes after CHG soap is applied, then you may rinse off the CHG soap.  Pat dry with a clean towel  Put on clean clothes/pajamas   If you choose to wear lotion, please use ONLY the CHG-compatible lotions on the back of this paper.   Additional instructions for the day of surgery: DO NOT APPLY any lotions, deodorants, cologne, or perfumes.   Do not bring valuables to the hospital. [facility] is not responsible for any belongings/valuables. Do not wear nail polish, gel polish, artificial nails, or any other type of covering on natural nails (fingers and toes) Do not wear jewelry or makeup Put on clean/comfortable clothes.  Please brush your teeth.  Ask your nurse before applying any prescription medications to the skin.     CHG Compatible Lotions   Aveeno Moisturizing lotion  Cetaphil Moisturizing Cream  Cetaphil Moisturizing Lotion  Clairol Herbal Essence Moisturizing Lotion, Dry Skin  Clairol Herbal Essence Moisturizing Lotion, Extra Dry Skin  Clairol Herbal Essence Moisturizing Lotion, Normal Skin  Curel Age Defying Therapeutic Moisturizing Lotion with Alpha Hydroxy  Curel Extreme Care Body Lotion  Curel Soothing Hands Moisturizing Hand Lotion  Curel Therapeutic Moisturizing Cream, Fragrance-Free  Curel Therapeutic Moisturizing Lotion, Fragrance-Free  Curel Therapeutic Moisturizing Lotion,  Original Formula  Eucerin Daily Replenishing Lotion  Eucerin Dry Skin Therapy Plus Alpha Hydroxy Crme  Eucerin Dry Skin Therapy Plus Alpha Hydroxy Lotion  Eucerin Original Crme  Eucerin Original Lotion  Eucerin Plus Crme Eucerin Plus Lotion  Eucerin TriLipid Replenishing Lotion  Keri Anti-Bacterial Hand Lotion  Keri Deep Conditioning Original Lotion Dry Skin Formula Softly Scented  Keri Deep Conditioning Original Lotion, Fragrance Free Sensitive Skin Formula  Keri Lotion Fast Absorbing Fragrance Free Sensitive Skin Formula  Keri Lotion Fast Absorbing Softly Scented Dry Skin Formula  Keri Original Lotion  Keri Skin Renewal Lotion Keri Silky Smooth Lotion  Keri Silky Smooth Sensitive Skin Lotion  Nivea Body Creamy Conditioning Oil  Nivea Body Extra Enriched Teacher, adult education Moisturizing Lotion Nivea Crme  Nivea Skin Firming Lotion  NutraDerm 30 Skin Lotion  NutraDerm Skin Lotion  NutraDerm Therapeutic Skin Cream  NutraDerm Therapeutic Skin Lotion  ProShield Protective Hand Cream  Provon moisturizing lotion  Please read over the following fact sheets that you were given.

## 2023-02-19 ENCOUNTER — Other Ambulatory Visit: Payer: Self-pay

## 2023-02-19 ENCOUNTER — Encounter (HOSPITAL_COMMUNITY): Payer: Self-pay

## 2023-02-19 ENCOUNTER — Inpatient Hospital Stay (HOSPITAL_COMMUNITY)
Admission: RE | Admit: 2023-02-19 | Discharge: 2023-02-19 | Disposition: A | Discharge status: Home / Self Care | Payer: Medicare PPO | Source: Ambulatory Visit | Attending: Neurological Surgery

## 2023-02-19 VITALS — BP 133/90 | HR 85 | Temp 97.9°F | Resp 18 | Ht 64.0 in | Wt 209.7 lb

## 2023-02-19 DIAGNOSIS — I10 Essential (primary) hypertension: Secondary | ICD-10-CM | POA: Insufficient documentation

## 2023-02-19 DIAGNOSIS — R9431 Abnormal electrocardiogram [ECG] [EKG]: Secondary | ICD-10-CM | POA: Insufficient documentation

## 2023-02-19 DIAGNOSIS — Z01818 Encounter for other preprocedural examination: Secondary | ICD-10-CM | POA: Insufficient documentation

## 2023-02-19 HISTORY — DX: Other complications of anesthesia, initial encounter: T88.59XA

## 2023-02-19 LAB — COMPREHENSIVE METABOLIC PANEL
ALT: 19 U/L (ref 0–44)
AST: 25 U/L (ref 15–41)
Albumin: 4 g/dL (ref 3.5–5.0)
Alkaline Phosphatase: 70 U/L (ref 38–126)
Anion gap: 11 (ref 5–15)
BUN: 11 mg/dL (ref 8–23)
CO2: 28 mmol/L (ref 22–32)
Calcium: 10.4 mg/dL — ABNORMAL HIGH (ref 8.9–10.3)
Chloride: 102 mmol/L (ref 98–111)
Creatinine, Ser: 0.77 mg/dL (ref 0.44–1.00)
GFR, Estimated: >60 mL/min (ref >60)
Glucose, Bld: 87 mg/dL (ref 70–99)
Potassium: 3.9 mmol/L (ref 3.5–5.1)
Sodium: 141 mmol/L (ref 135–145)
Total Bilirubin: 0.9 mg/dL (ref 0.0–1.2)
Total Protein: 7.2 g/dL (ref 6.5–8.1)

## 2023-02-19 LAB — CBC
HCT: 46.5 % — ABNORMAL HIGH (ref 36.0–46.0)
Hemoglobin: 15 g/dL (ref 12.0–15.0)
MCH: 28.4 pg (ref 26.0–34.0)
MCHC: 32.3 g/dL (ref 30.0–36.0)
MCV: 87.9 fL (ref 80.0–100.0)
Platelets: 240 10*3/uL (ref 150–400)
RBC: 5.29 MIL/uL — ABNORMAL HIGH (ref 3.87–5.11)
RDW: 14.3 % (ref 11.5–15.5)
WBC: 10.8 10*3/uL — ABNORMAL HIGH (ref 4.0–10.5)
nRBC: 0 % (ref 0.0–0.2)

## 2023-02-19 LAB — TYPE AND SCREEN
ABO/RH(D): O POS
Antibody Screen: NEGATIVE

## 2023-02-19 LAB — PROTIME-INR
INR: 0.9 (ref 0.8–1.2)
Prothrombin Time: 12.6 s (ref 11.4–15.2)

## 2023-02-19 LAB — SURGICAL PCR SCREEN
MRSA, PCR: NEGATIVE
Staphylococcus aureus: NEGATIVE

## 2023-02-19 NOTE — Progress Notes (Signed)
PCP - Eden Emms, NP  Cardiologist -   PPM/ICD - denies Device Orders - n/a Rep Notified - n/a  Chest x-ray - denies EKG - 02-19-23 Stress Test - denies ECHO - denies Cardiac Cath - denies  Sleep Study - denies CPAP - n/a  DM - denies  Blood Thinner Instructions: denies Aspirin Instructions: n/a  ERAS Protcol -Clear liquids until 9:50   COVID TEST- n/a   Anesthesia review: yes HTN, EKG review  Patient denies shortness of breath, fever, cough and chest pain at PAT appointment   All instructions explained to the patient, with a verbal understanding of the material. Patient agrees to go over the instructions while at home for a better understanding. Patient also instructed to self quarantine after being tested for COVID-19. The opportunity to ask questions was provided.

## 2023-02-28 ENCOUNTER — Other Ambulatory Visit: Payer: Self-pay

## 2023-02-28 ENCOUNTER — Ambulatory Visit (HOSPITAL_COMMUNITY): Payer: Medicare PPO

## 2023-02-28 ENCOUNTER — Encounter (HOSPITAL_COMMUNITY)
Admission: RE | Disposition: A | Discharge status: Home / Self Care | Payer: Self-pay | Source: Home / Self Care | Attending: Neurological Surgery

## 2023-02-28 ENCOUNTER — Ambulatory Visit (HOSPITAL_BASED_OUTPATIENT_CLINIC_OR_DEPARTMENT_OTHER): Payer: Medicare PPO | Admitting: Certified Registered Nurse Anesthetist

## 2023-02-28 ENCOUNTER — Observation Stay (HOSPITAL_COMMUNITY)
Admission: RE | Admit: 2023-02-28 | Discharge: 2023-03-02 | Disposition: A | Discharge status: Home / Self Care | Payer: Medicare PPO | Attending: Neurological Surgery | Admitting: Neurological Surgery

## 2023-02-28 ENCOUNTER — Encounter (HOSPITAL_COMMUNITY): Payer: Self-pay | Admitting: Neurological Surgery

## 2023-02-28 ENCOUNTER — Ambulatory Visit (HOSPITAL_COMMUNITY): Payer: Medicare PPO | Admitting: Physician Assistant

## 2023-02-28 DIAGNOSIS — M48061 Spinal stenosis, lumbar region without neurogenic claudication: Secondary | ICD-10-CM | POA: Diagnosis not present

## 2023-02-28 DIAGNOSIS — Z981 Arthrodesis status: Principal | ICD-10-CM

## 2023-02-28 DIAGNOSIS — E785 Hyperlipidemia, unspecified: Secondary | ICD-10-CM | POA: Diagnosis not present

## 2023-02-28 DIAGNOSIS — M4726 Other spondylosis with radiculopathy, lumbar region: Principal | ICD-10-CM | POA: Insufficient documentation

## 2023-02-28 DIAGNOSIS — M4316 Spondylolisthesis, lumbar region: Secondary | ICD-10-CM | POA: Insufficient documentation

## 2023-02-28 DIAGNOSIS — I10 Essential (primary) hypertension: Secondary | ICD-10-CM | POA: Insufficient documentation

## 2023-02-28 DIAGNOSIS — Z79899 Other long term (current) drug therapy: Secondary | ICD-10-CM | POA: Diagnosis not present

## 2023-02-28 DIAGNOSIS — Z96652 Presence of left artificial knee joint: Secondary | ICD-10-CM | POA: Insufficient documentation

## 2023-02-28 HISTORY — PX: POSTERIOR FUSION PEDICLE SCREW PLACEMENT: SHX2186

## 2023-02-28 SURGERY — POSTERIOR LUMBAR FUSION 1 LEVEL
Anesthesia: General | Site: Spine Lumbar

## 2023-02-28 MED ORDER — ONDANSETRON HCL 4 MG/2ML IJ SOLN: 4.0000 mg | Freq: Four times a day (QID) | INTRAMUSCULAR | Status: DC | PRN

## 2023-02-28 MED ORDER — PHENYLEPHRINE 80 MCG/ML (10ML) SYRINGE FOR IV PUSH (FOR BLOOD PRESSURE SUPPORT)
PREFILLED_SYRINGE | INTRAVENOUS | Status: AC
  Filled 2023-02-28: qty 10

## 2023-02-28 MED ORDER — GABAPENTIN 400 MG PO CAPS
800.0000 mg | ORAL_CAPSULE | Freq: Every day | ORAL | Status: DC
  Administered 2023-03-01: 800 mg via ORAL
  Filled 2023-02-28: qty 2

## 2023-02-28 MED ORDER — AMISULPRIDE (ANTIEMETIC) 5 MG/2ML IV SOLN: 10.0000 mg | Freq: Once | INTRAVENOUS | Status: DC | PRN

## 2023-02-28 MED ORDER — ONDANSETRON HCL 4 MG/2ML IJ SOLN
INTRAMUSCULAR | Status: AC
  Filled 2023-02-28: qty 2

## 2023-02-28 MED ORDER — ORAL CARE MOUTH RINSE: 15.0000 mL | Freq: Once | OROMUCOSAL | Status: AC

## 2023-02-28 MED ORDER — LOSARTAN POTASSIUM 50 MG PO TABS
100.0000 mg | ORAL_TABLET | Freq: Every day | ORAL | Status: DC
Start: 2023-02-28 — End: 2023-03-02
  Administered 2023-02-28 – 2023-03-01 (×2): 100 mg via ORAL
  Filled 2023-02-28 (×2): qty 2

## 2023-02-28 MED ORDER — OXYCODONE HCL 5 MG PO TABS
5.0000 mg | ORAL_TABLET | Freq: Once | ORAL | Status: AC | PRN
  Administered 2023-02-28: 5 mg via ORAL

## 2023-02-28 MED ORDER — LIDOCAINE 2% (20 MG/ML) 5 ML SYRINGE
INTRAMUSCULAR | Status: DC | PRN
  Administered 2023-02-28: 60 mg via INTRAVENOUS

## 2023-02-28 MED ORDER — KCL IN DEXTROSE-NACL 10-5-0.45 MEQ/L-%-% IV SOLN
INTRAVENOUS | Status: AC
  Filled 2023-02-28: qty 1000

## 2023-02-28 MED ORDER — TIZANIDINE HCL 4 MG PO TABS
4.0000 mg | ORAL_TABLET | Freq: Three times a day (TID) | ORAL | Status: DC | PRN
Start: 2023-02-28 — End: 2023-03-02
  Administered 2023-02-28 – 2023-03-01 (×3): 4 mg via ORAL
  Filled 2023-02-28 (×4): qty 1

## 2023-02-28 MED ORDER — HYDROMORPHONE HCL 1 MG/ML IJ SOLN
INTRAMUSCULAR | Status: AC
  Filled 2023-02-28: qty 0.5

## 2023-02-28 MED ORDER — OXYCODONE HCL 5 MG/5ML PO SOLN: 5.0000 mg | Freq: Once | ORAL | Status: AC | PRN

## 2023-02-28 MED ORDER — DEXAMETHASONE SODIUM PHOSPHATE 10 MG/ML IJ SOLN
INTRAMUSCULAR | Status: DC | PRN
  Administered 2023-02-28: 10 mg via INTRAVENOUS

## 2023-02-28 MED ORDER — THROMBIN 5000 UNITS EX SOLR
CUTANEOUS | Status: AC
  Filled 2023-02-28: qty 5000

## 2023-02-28 MED ORDER — SODIUM CHLORIDE 0.9 % IV SOLN
250.0000 mL | INTRAVENOUS | Status: AC
  Administered 2023-02-28: 250 mL via INTRAVENOUS

## 2023-02-28 MED ORDER — CHLORHEXIDINE GLUCONATE CLOTH 2 % EX PADS: 6.0000 | MEDICATED_PAD | Freq: Once | CUTANEOUS | Status: DC

## 2023-02-28 MED ORDER — OXYCODONE HCL 5 MG PO TABS
5.0000 mg | ORAL_TABLET | ORAL | Status: DC | PRN
  Administered 2023-02-28: 10 mg via ORAL
  Administered 2023-02-28 – 2023-03-01 (×4): 5 mg via ORAL
  Filled 2023-02-28: qty 2
  Filled 2023-02-28 (×6): qty 1

## 2023-02-28 MED ORDER — OXYCODONE HCL 5 MG PO TABS: 5.0000 mg | ORAL_TABLET | ORAL | Status: DC | PRN

## 2023-02-28 MED ORDER — SUGAMMADEX SODIUM 200 MG/2ML IV SOLN
INTRAVENOUS | Status: DC | PRN
  Administered 2023-02-28: 200 mg via INTRAVENOUS

## 2023-02-28 MED ORDER — 0.9 % SODIUM CHLORIDE (POUR BTL) OPTIME
TOPICAL | Status: DC | PRN
  Administered 2023-02-28: 1000 mL

## 2023-02-28 MED ORDER — FENTANYL CITRATE (PF) 250 MCG/5ML IJ SOLN
INTRAMUSCULAR | Status: AC
  Filled 2023-02-28: qty 5

## 2023-02-28 MED ORDER — ONDANSETRON HCL 4 MG PO TABS: 4.0000 mg | ORAL_TABLET | Freq: Four times a day (QID) | ORAL | Status: DC | PRN

## 2023-02-28 MED ORDER — SENNA 8.6 MG PO TABS
1.0000 | ORAL_TABLET | Freq: Two times a day (BID) | ORAL | Status: DC
  Administered 2023-02-28 – 2023-03-02 (×4): 8.6 mg via ORAL
  Filled 2023-02-28 (×4): qty 1

## 2023-02-28 MED ORDER — THROMBIN 5000 UNITS EX SOLR: OROMUCOSAL | Status: DC | PRN

## 2023-02-28 MED ORDER — ONDANSETRON HCL 4 MG/2ML IJ SOLN
INTRAMUSCULAR | Status: DC | PRN
  Administered 2023-02-28: 4 mg via INTRAVENOUS

## 2023-02-28 MED ORDER — HYDROMORPHONE HCL 1 MG/ML IJ SOLN
INTRAMUSCULAR | Status: DC | PRN
  Administered 2023-02-28: .5 mg via INTRAVENOUS

## 2023-02-28 MED ORDER — OXYCODONE HCL 5 MG PO TABS
ORAL_TABLET | ORAL | Status: AC
  Filled 2023-02-28: qty 1

## 2023-02-28 MED ORDER — MORPHINE SULFATE (PF) 2 MG/ML IV SOLN
2.0000 mg | INTRAVENOUS | Status: DC | PRN
  Administered 2023-02-28: 2 mg via INTRAVENOUS
  Filled 2023-02-28: qty 1

## 2023-02-28 MED ORDER — DEXAMETHASONE SODIUM PHOSPHATE 10 MG/ML IJ SOLN
INTRAMUSCULAR | Status: AC
  Filled 2023-02-28: qty 1

## 2023-02-28 MED ORDER — GABAPENTIN 300 MG PO CAPS
300.0000 mg | ORAL_CAPSULE | ORAL | Status: AC
  Administered 2023-02-28: 300 mg via ORAL
  Filled 2023-02-28: qty 1

## 2023-02-28 MED ORDER — LACTATED RINGERS IV SOLN: INTRAVENOUS | Status: DC | PRN

## 2023-02-28 MED ORDER — EPHEDRINE SULFATE-NACL 50-0.9 MG/10ML-% IV SOSY
PREFILLED_SYRINGE | INTRAVENOUS | Status: DC | PRN
  Administered 2023-02-28: 10 mg via INTRAVENOUS
  Administered 2023-02-28 (×3): 5 mg via INTRAVENOUS

## 2023-02-28 MED ORDER — HYDROCHLOROTHIAZIDE 25 MG PO TABS
25.0000 mg | ORAL_TABLET | Freq: Every day | ORAL | Status: DC
Start: 2023-02-28 — End: 2023-03-02
  Administered 2023-02-28 – 2023-03-02 (×3): 25 mg via ORAL
  Filled 2023-02-28 (×3): qty 1

## 2023-02-28 MED ORDER — FENTANYL CITRATE (PF) 100 MCG/2ML IJ SOLN
25.0000 ug | INTRAMUSCULAR | Status: DC | PRN
  Administered 2023-02-28: 50 ug via INTRAVENOUS

## 2023-02-28 MED ORDER — DOCUSATE SODIUM 100 MG PO CAPS
100.0000 mg | ORAL_CAPSULE | Freq: Two times a day (BID) | ORAL | Status: DC | PRN
  Administered 2023-02-28: 100 mg via ORAL
  Filled 2023-02-28: qty 1

## 2023-02-28 MED ORDER — PROPOFOL 10 MG/ML IV BOLUS
INTRAVENOUS | Status: DC | PRN
  Administered 2023-02-28: 150 mg via INTRAVENOUS

## 2023-02-28 MED ORDER — MENTHOL 3 MG MT LOZG: 1.0000 | LOZENGE | OROMUCOSAL | Status: DC | PRN

## 2023-02-28 MED ORDER — THROMBIN 20000 UNITS EX SOLR: CUTANEOUS | Status: DC | PRN

## 2023-02-28 MED ORDER — ACETAMINOPHEN 500 MG PO TABS
1000.0000 mg | ORAL_TABLET | ORAL | Status: AC
Start: 2023-02-28 — End: 2023-02-28
  Administered 2023-02-28: 1000 mg via ORAL
  Filled 2023-02-28: qty 2

## 2023-02-28 MED ORDER — SODIUM CHLORIDE 0.9% FLUSH: 3.0000 mL | INTRAVENOUS | Status: DC | PRN

## 2023-02-28 MED ORDER — FENTANYL CITRATE (PF) 100 MCG/2ML IJ SOLN
INTRAMUSCULAR | Status: AC
  Filled 2023-02-28: qty 2

## 2023-02-28 MED ORDER — ACETAMINOPHEN 650 MG RE SUPP: 650.0000 mg | RECTAL | Status: DC | PRN

## 2023-02-28 MED ORDER — PRAVASTATIN SODIUM 10 MG PO TABS
20.0000 mg | ORAL_TABLET | Freq: Every day | ORAL | Status: DC
  Administered 2023-02-28 – 2023-03-01 (×2): 20 mg via ORAL
  Filled 2023-02-28 (×2): qty 2

## 2023-02-28 MED ORDER — THROMBIN 20000 UNITS EX SOLR
CUTANEOUS | Status: AC
  Filled 2023-02-28: qty 20000

## 2023-02-28 MED ORDER — ROCURONIUM BROMIDE 10 MG/ML (PF) SYRINGE
PREFILLED_SYRINGE | INTRAVENOUS | Status: AC
  Filled 2023-02-28: qty 10

## 2023-02-28 MED ORDER — PHENOL 1.4 % MT LIQD: 1.0000 | OROMUCOSAL | Status: DC | PRN

## 2023-02-28 MED ORDER — BUPIVACAINE HCL (PF) 0.25 % IJ SOLN
INTRAMUSCULAR | Status: DC | PRN
  Administered 2023-02-28: 5 mL

## 2023-02-28 MED ORDER — SODIUM CHLORIDE 0.9% FLUSH
3.0000 mL | Freq: Two times a day (BID) | INTRAVENOUS | Status: DC
  Administered 2023-02-28: 3 mL via INTRAVENOUS

## 2023-02-28 MED ORDER — BUPIVACAINE HCL (PF) 0.25 % IJ SOLN
INTRAMUSCULAR | Status: AC
  Filled 2023-02-28: qty 30

## 2023-02-28 MED ORDER — DULOXETINE HCL 30 MG PO CPEP
30.0000 mg | ORAL_CAPSULE | Freq: Every day | ORAL | Status: DC
  Administered 2023-02-28 – 2023-03-01 (×2): 30 mg via ORAL
  Filled 2023-02-28 (×2): qty 1

## 2023-02-28 MED ORDER — MIDAZOLAM HCL 2 MG/2ML IJ SOLN
INTRAMUSCULAR | Status: AC
  Filled 2023-02-28: qty 2

## 2023-02-28 MED ORDER — CEFAZOLIN SODIUM-DEXTROSE 2-4 GM/100ML-% IV SOLN
2.0000 g | Freq: Three times a day (TID) | INTRAVENOUS | Status: AC
  Administered 2023-02-28 – 2023-03-01 (×2): 2 g via INTRAVENOUS
  Filled 2023-02-28 (×2): qty 100

## 2023-02-28 MED ORDER — CHLORHEXIDINE GLUCONATE 0.12 % MT SOLN
15.0000 mL | Freq: Once | OROMUCOSAL | Status: AC
  Administered 2023-02-28: 15 mL via OROMUCOSAL
  Filled 2023-02-28: qty 15

## 2023-02-28 MED ORDER — FENTANYL CITRATE (PF) 250 MCG/5ML IJ SOLN
INTRAMUSCULAR | Status: DC | PRN
  Administered 2023-02-28: 150 ug via INTRAVENOUS
  Administered 2023-02-28 (×2): 50 ug via INTRAVENOUS

## 2023-02-28 MED ORDER — CEFAZOLIN SODIUM-DEXTROSE 2-4 GM/100ML-% IV SOLN
2.0000 g | INTRAVENOUS | Status: AC
  Administered 2023-02-28: 2 g via INTRAVENOUS
  Filled 2023-02-28: qty 100

## 2023-02-28 MED ORDER — ROCURONIUM BROMIDE 10 MG/ML (PF) SYRINGE
PREFILLED_SYRINGE | INTRAVENOUS | Status: DC | PRN
  Administered 2023-02-28: 20 mg via INTRAVENOUS
  Administered 2023-02-28: 60 mg via INTRAVENOUS
  Administered 2023-02-28: 20 mg via INTRAVENOUS

## 2023-02-28 MED ORDER — ACETAMINOPHEN 325 MG PO TABS: 650.0000 mg | ORAL_TABLET | ORAL | Status: DC | PRN

## 2023-02-28 SURGICAL SUPPLY — 53 items
ALLOGRAFT BONE FIBER KORE 5 (Bone Implant) IMPLANT
BAG COUNTER SPONGE SURGICOUNT (BAG) ×1 IMPLANT
BASKET BONE COLLECTION (BASKET) ×1 IMPLANT
BENZOIN TINCTURE PRP APPL 2/3 (GAUZE/BANDAGES/DRESSINGS) ×1 IMPLANT
BLADE BONE MILL MEDIUM (MISCELLANEOUS) ×1 IMPLANT
BLADE CLIPPER SURG (BLADE) IMPLANT
BUR CARBIDE MATCH 3.0 (BURR) ×1 IMPLANT
CANISTER SUCT 3000ML PPV (MISCELLANEOUS) ×1 IMPLANT
CNTNR URN SCR LID CUP LEK RST (MISCELLANEOUS) ×1 IMPLANT
COVER BACK TABLE 60X90IN (DRAPES) ×1 IMPLANT
DERMABOND ADVANCED .7 DNX12 (GAUZE/BANDAGES/DRESSINGS) ×1 IMPLANT
DRAPE C-ARM 42X72 X-RAY (DRAPES) ×2 IMPLANT
DRAPE C-ARMOR (DRAPES) ×1 IMPLANT
DRAPE LAPAROTOMY 100X72X124 (DRAPES) ×1 IMPLANT
DRAPE SURG 17X23 STRL (DRAPES) ×1 IMPLANT
DRSG OPSITE POSTOP 4X6 (GAUZE/BANDAGES/DRESSINGS) IMPLANT
DURAPREP 26ML APPLICATOR (WOUND CARE) ×1 IMPLANT
ELECT REM PT RETURN 9FT ADLT (ELECTROSURGICAL) ×1
ELECTRODE REM PT RTRN 9FT ADLT (ELECTROSURGICAL) ×1 IMPLANT
EVACUATOR 1/8 PVC DRAIN (DRAIN) ×1 IMPLANT
GAUZE 4X4 16PLY ~~LOC~~+RFID DBL (SPONGE) IMPLANT
GLOVE BIO SURGEON STRL SZ7 (GLOVE) IMPLANT
GLOVE BIO SURGEON STRL SZ8 (GLOVE) ×2 IMPLANT
GLOVE BIOGEL PI IND STRL 7.0 (GLOVE) IMPLANT
GOWN STRL REUS W/ TWL LRG LVL3 (GOWN DISPOSABLE) IMPLANT
GOWN STRL REUS W/ TWL XL LVL3 (GOWN DISPOSABLE) ×2 IMPLANT
GOWN STRL REUS W/TWL 2XL LVL3 (GOWN DISPOSABLE) IMPLANT
GRAFT BONE PROTEIOS MED 2.5CC (Orthopedic Implant) IMPLANT
HEMOSTAT POWDER KIT SURGIFOAM (HEMOSTASIS) ×1 IMPLANT
KIT BASIN OR (CUSTOM PROCEDURE TRAY) ×1 IMPLANT
KIT TURNOVER KIT B (KITS) ×1 IMPLANT
MILL BONE PREP (MISCELLANEOUS) ×1 IMPLANT
NDL HYPO 25X1 1.5 SAFETY (NEEDLE) ×1 IMPLANT
NEEDLE HYPO 25X1 1.5 SAFETY (NEEDLE) ×1 IMPLANT
NS IRRIG 1000ML POUR BTL (IV SOLUTION) ×1 IMPLANT
PACK LAMINECTOMY NEURO (CUSTOM PROCEDURE TRAY) ×1 IMPLANT
PAD ARMBOARD 7.5X6 YLW CONV (MISCELLANEOUS) ×3 IMPLANT
ROD LORD LIPPED TI 5.5X45 (Rod) IMPLANT
SCREW POLYAXIAL TULIP (Screw) IMPLANT
SCREW SHANK MOD 5.5X40 (Screw) IMPLANT
SCREW SHANK MOD CC 5X40 (Screw) IMPLANT
SET SCREW SPNE (Screw) IMPLANT
SOLUTION IRRIG SURGIPHOR (IV SOLUTION) ×1 IMPLANT
SPACER IDENTITI PS 9X9X25 15D (Spacer) IMPLANT
SPONGE SURGIFOAM ABS GEL 100 (HEMOSTASIS) ×1 IMPLANT
SPONGE T-LAP 4X18 ~~LOC~~+RFID (SPONGE) IMPLANT
STRIP CLOSURE SKIN 1/2X4 (GAUZE/BANDAGES/DRESSINGS) ×1 IMPLANT
SUT VIC AB 0 CT1 18XCR BRD8 (SUTURE) ×1 IMPLANT
SUT VIC AB 2-0 CP2 18 (SUTURE) ×1 IMPLANT
SUT VIC AB 3-0 SH 8-18 (SUTURE) ×2 IMPLANT
TOWEL GREEN STERILE (TOWEL DISPOSABLE) ×1 IMPLANT
TOWEL GREEN STERILE FF (TOWEL DISPOSABLE) ×1 IMPLANT
WATER STERILE IRR 1000ML POUR (IV SOLUTION) ×1 IMPLANT

## 2023-02-28 NOTE — Anesthesia Procedure Notes (Signed)
 Procedure Name: Intubation Date/Time: 02/28/2023 12:39 PM  Performed by: Vinita Geanie LABOR, CRNAPre-anesthesia Checklist: Patient identified, Emergency Drugs available, Suction available and Patient being monitored Patient Re-evaluated:Patient Re-evaluated prior to induction Oxygen Delivery Method: Circle system utilized Preoxygenation: Pre-oxygenation with 100% oxygen Induction Type: IV induction Ventilation: Mask ventilation without difficulty Laryngoscope Size: 3 Grade View: Grade I Tube type: Oral Tube size: 7.5 mm Number of attempts: 1 Airway Equipment and Method: Stylet and Oral airway Placement Confirmation: ETT inserted through vocal cords under direct vision, positive ETCO2 and breath sounds checked- equal and bilateral Secured at: 22 cm Tube secured with: Tape Dental Injury: Teeth and Oropharynx as per pre-operative assessment

## 2023-02-28 NOTE — H&P (Signed)
 Subjective: Patient is a 73 y.o. female admitted for R leg pain. Onset of symptoms was several months ago, gradually worsening since that time.  The pain is rated severe, and is located at the across the lower back and radiates to RLE. The pain is described as aching and occurs all day. The symptoms have been progressive. Symptoms are exacerbated by exercise, standing, and walking for more than a few minutes. MRI or CT showed adjacent level stenosis   Past Medical History:  Diagnosis Date   Arthritis    Complication of anesthesia    per patient had shaking after procedures   Cyst of ovary    GERD (gastroesophageal reflux disease)    History of kidney stones    Hyperlipidemia    Hypertension    Liver cyst    per imaging 08 /2018   Ovarian cyst    Right   Renal calculus, left    Wears contact lenses     Past Surgical History:  Procedure Laterality Date   CATARACT EXTRACTION W/ INTRAOCULAR LENS  IMPLANT, BILATERAL Bilateral    CYSTOSCOPY N/A 11/16/2017   Procedure: CYSTOSCOPY;  Surgeon: Verdon Keen, MD;  Location: [facility];  Service: Gynecology;  Laterality: N/A;   CYSTOSCOPY WITH RETROGRADE PYELOGRAM, URETEROSCOPY AND STENT PLACEMENT Left 02/26/2017   Procedure: CYSTOSCOPY WITH RETROGRADE PYELOGRAM, URETEROSCOPY AND STENT PLACEMENT, STONE BASKET RETRIVAL;  Surgeon: Sherrilee Belvie CROME, MD;  [facility]: [facility];  Service: Urology;  Laterality: Left;   EXCISION MASS, RECONSTRUCTION LEFT INDEX FINGER NAIL BED  10-11-2010    dr murrell  [facility]   HAMMER TOE SURGERY Right 02/2016   HOLMIUM LASER APPLICATION Left 02/26/2017   Procedure: HOLMIUM LASER APPLICATION;  Surgeon: Sherrilee Belvie CROME, MD;  Location: [facility];  Service: Urology;  Laterality: Left;   KNEE ARTHROSCOPY Left 1992   LAPAROSCOPIC BILATERAL SALPINGO OOPHERECTOMY Bilateral 11/16/2017   Procedure: LAPAROSCOPIC BILATERAL SALPINGO OOPHORECTOMY;  Surgeon: Verdon Keen, MD;  Location: [facility]  ORS;  Service: Gynecology;  Laterality: Bilateral;   LAPAROSCOPIC LYSIS OF ADHESIONS N/A 11/16/2017   Procedure: LAPAROSCOPIC LYSIS OF ADHESIONS;  Surgeon: Verdon Keen, MD;  Location: [facility] OR[facility];  Service: Gynecology;  Laterality: N/A;   SPINAL FUSION  04/20/2020   TOTAL KNEE ARTHROPLASTY Left 04-14-2014    dr j. poggi  [facility]   VAGINAL HYSTERECTOMY  2000    Prior to Admission medications   Medication Sig Start Date End Date Taking? Authorizing Provider  B Complex-C (SUPER B COMPLEX PO) Take 1 capsule by mouth in the morning.   Yes [provider]  Calcium Carb-Cholecalciferol (CALCIUM 600 + D PO) Take 2 tablets by mouth in the morning.   Yes [provider]  Cholecalciferol (VITAMIN D) 2000 units tablet Take 2,000 Units by mouth in the morning.   Yes [provider]  DULoxetine  (CYMBALTA ) 30 MG capsule Take 1 capsule (30 mg total) by mouth daily. Patient taking differently: Take 30 mg by mouth at bedtime. 11/03/22  Yes Wendee Lynwood HERO, NP  Glucosamine HCl-MSM (GLUCOSAMINE-MSM PO) Take 2 tablets by mouth in the morning.   Yes [provider]  hydrochlorothiazide  (HYDRODIURIL ) 25 MG tablet Take 1 tablet (25 mg total) by mouth daily. 11/03/22  Yes Wendee Lynwood HERO, NP  loratadine (CLARITIN) 10 MG tablet Take 10 mg by mouth in the morning.   Yes [provider]  losartan  (COZAAR ) 100 MG tablet Take 1 tablet (100 mg total) by mouth at bedtime. 12/22/22  Yes Cable,  Lynwood HERO, NP  Multiple Vitamin (MULTIVITAMIN WITH MINERALS) TABS tablet Take 1 tablet by mouth in the morning.   Yes [provider]  pravastatin  (PRAVACHOL ) 20 MG tablet TAKE ONE TABLET BY MOUTH ONE TIME DAILY Patient taking differently: Take 20 mg by mouth at bedtime. 10/06/22  Yes Wendee Lynwood HERO, NP  Probiotic Product (PROBIOTIC PO) Take 1 capsule by mouth in the morning.   Yes [provider]  tiZANidine  (ZANAFLEX ) 4 MG capsule Take 4 mg by mouth at bedtime as needed for muscle  spasms.   Yes [provider]  gabapentin  (NEURONTIN ) 800 MG tablet Take 1 tablet (800 mg total) by mouth at bedtime for 14 days. Take nightly for 3 days, then up to 14 days as needed 11/16/17 11/30/17  Verdon Keen, MD  omeprazole (PRILOSEC) 20 MG capsule Take 20 mg by mouth daily as needed (indigestion/heartburn.).    [provider]   Allergies  Allergen Reactions   Hydrocodone  Other (See Comments)    insomnia   Ibuprofen Other (See Comments)    Causes GI upset    Prednisone Other (See Comments)    Insomnia    Social History   Tobacco Use   Smoking status: Never   Smokeless tobacco: Never  Substance Use Topics   Alcohol use: Yes    Alcohol/week: 1.0 standard drink of alcohol    Types: 1 Glasses of wine per week    Comment: occasional    Family History  Problem Relation Age of Onset   Breast cancer Mother 39     Review of Systems  Positive ROS: neg  All other systems have been reviewed and were otherwise negative with the exception of those mentioned in the HPI and as above.  Objective: Vital signs in last 24 hours: Temp:  [97.7 F (36.5 C)] 97.7 F (36.5 C) (01/08 1050) Pulse Rate:  [82] 82 (01/08 1050) Resp:  [18] 18 (01/08 1050) BP: (129)/(78) 129/78 (01/08 1050) SpO2:  [96 %] 96 % (01/08 1050) Weight:  [90.7 kg] 90.7 kg (01/08 1050)  General Appearance: Alert, cooperative, no distress, appears stated age Head: Normocephalic, without obvious abnormality, atraumatic Eyes: PERRL, conjunctiva/corneas clear, EOM's intact    Neck: Supple, symmetrical, trachea midline Back: Symmetric, no curvature, ROM normal, no CVA tenderness Lungs:  respirations unlabored Heart: Regular rate and rhythm Abdomen: Soft, non-tender Extremities: Extremities normal, atraumatic, no cyanosis or edema Pulses: 2+ and symmetric all extremities Skin: Skin color, texture, turgor normal, no rashes or lesions  NEUROLOGIC:   Mental status: Alert and oriented x4,  no  aphasia, good attention span, fund of knowledge, and memory Motor Exam - grossly normal Sensory Exam - grossly normal Reflexes: 1= Coordination - grossly normal Gait - grossly normal Balance - grossly normal Cranial Nerves: I: smell Not tested  II: visual acuity  OS: nl    OD: nl  II: visual fields Full to confrontation  II: pupils Equal, round, reactive to light  III,VII: ptosis None  III,IV,VI: extraocular muscles  Full ROM  V: mastication Normal  V: facial light touch sensation  Normal  V,VII: corneal reflex  Present  VII: facial muscle function - upper  Normal  VII: facial muscle function - lower Normal  VIII: hearing Not tested  IX: soft palate elevation  Normal  IX,X: gag reflex Present  XI: trapezius strength  5/5  XI: sternocleidomastoid strength 5/5  XI: neck flexion strength  5/5  XII: tongue strength  Normal    Data Review  Lab Results  Component Value Date   WBC 10.8 (H) 02/19/2023   HGB 15.0 02/19/2023   HCT 46.5 (H) 02/19/2023   MCV 87.9 02/19/2023   PLT 240 02/19/2023   Lab Results  Component Value Date   NA 141 02/19/2023   K 3.9 02/19/2023   CL 102 02/19/2023   CO2 28 02/19/2023   BUN 11 02/19/2023   CREATININE 0.77 02/19/2023   GLUCOSE 87 02/19/2023   Lab Results  Component Value Date   INR 0.9 02/19/2023    Assessment/Plan:  Estimated body mass index is 34.33 kg/m as calculated from the following:   Height as of this encounter: 5' 4 (1.626 m).   Weight as of this encounter: 90.7 kg. Patient admitted for PLIF L2-3. Patient has failed a reasonable attempt at conservative therapy.  I explained the condition and procedure to the patient and answered any questions.  Patient wishes to proceed with procedure as planned. Understands risks/ benefits and typical outcomes of procedure.   Alm GORMAN Molt 02/28/2023 11:19 AM

## 2023-02-28 NOTE — Op Note (Signed)
 02/28/2023  2:31 PM  PATIENT:  Lauren Carter  73 y.o. 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 Decompressive lumbar laminectomy, hemi facetectomy and foraminotomies L2-3 requiring more work than would be required for a simple exposure of the disk for PLIF in order to adequately decompress the neural elements and address the spinal stenosis 2. Posterior lumbar interbody fusion L2-3 using PTI interbody cages packed with morcellized allograft and autograft  3. Posterior fixation L2-3 using ATEC cortical pedicle screws.  4. Intertransverse arthrodesis L2-3 using morcellized autograft and allograft.  SURGEON:  Alm Molt, MD  ASSISTANTSBETHA Pean FNP  ANESTHESIA:  General  EBL: 200 ml  Total I/O In: 100 [IV Piggyback:100] Out: -   BLOOD ADMINISTERED:none  DRAINS: none   INDICATION FOR PROCEDURE: This patient presented with back pain with right leg pain. Imaging revealed adjacent level stenosis at L2-3 with retrolisthesis with previous fusion L3-S1. The patient tried a reasonable attempt at conservative medical measures without relief. I recommended decompression and instrumented fusion to address the stenosis as well as the segmental  instability.  Patient understood the risks, benefits, and alternatives and potential outcomes and wished to proceed.  PROCEDURE DETAILS:  The patient was brought to the operating room. After induction of generalized endotracheal anesthesia the patient was rolled into the prone position on chest rolls and all pressure points were padded. The patient's lumbar region was cleaned and then prepped with DuraPrep and draped in the usual sterile fashion. Anesthesia was injected and then a dorsal midline incision was made and carried down to the lumbosacral fascia. The fascia was opened and the  paraspinous musculature was taken down in a subperiosteal fashion to expose L2-3 as well as the previously placed instrumentation. A self-retaining retractor was placed.  We removed the locking caps from the previously placed instrumentation and remove the rods.  All screws had good purchase and pulling on the L4 screw made the entire construct moved in unison suggesting arthrodesis.  Intraoperative fluoroscopy confirmed my level, and I started with placement of the L2 cortical pedicle screws. The pedicle screw entry zones were identified utilizing surface landmarks and  AP and lateral fluoroscopy. I scored the cortex with the high-speed drill and then used the hand drill to drill an upward and outward direction into the pedicle. I then tapped line to line. I then placed a 5.5 x 40 mm cortical pedicle screw into the pedicles of L2 on the left and a 5.0 x 40 mm screw on the right.    I then turned my attention to the decompression and complete lumbar laminectomies, hemi- facetectomies, and foraminotomies were performed at L2-3.  My nurse practitioner was directly involved in the decompression and exposure of the neural elements. the patient had significant spinal stenosis and this required more work than would be required for a simple exposure of the disc for posterior lumbar interbody fusion which would only require a limited laminotomy. Much more generous decompression and generous foraminotomy was undertaken in order to adequately decompress the neural elements and address the patient's leg pain. The yellow ligament was removed to expose the underlying dura and nerve roots, and generous foraminotomies were performed to adequately decompress the neural elements. Both the exiting and traversing nerve roots were decompressed on both sides until a coronary dilator passed easily along the nerve roots. Once the decompression was  complete, I turned my attention to the posterior lower lumbar interbody fusion. The  epidural venous vasculature was coagulated and cut sharply. Disc space was incised and the initial discectomy was performed with pituitary rongeurs. The disc space was distracted with sequential distractors to a height of 9 mm. We then used a series of scrapers and shavers to prepare the endplates for fusion. The midline was prepared with Epstein curettes. Once the complete discectomy was finished, we packed an appropriate sized interbody cage with local autograft and morcellized allograft, gently retracted the nerve root, and tapped the cage into position at L2-3.  The midline between the cages was packed with morselized autograft and allograft.   We then decorticated the transverse processes and laid a mixture of morcellized autograft and allograft out over these to perform intertransverse arthrodesis at L2-3. We then placed lordotic rods into the multiaxial screw heads of the pedicle screws and locked these in position with the locking caps and anti-torque device. We then checked our construct with AP and lateral fluoroscopy. Irrigated with copious amounts of 0.5% povidone iodine solution followed by saline solution. Inspected the nerve roots once again to assure adequate decompression, lined to the dura with Gelfoam,  and then we closed the muscle and the fascia with 0 Vicryl. Closed the subcutaneous tissues with 2-0 Vicryl and subcuticular tissues with 3-0 Vicryl. The skin was closed with benzoin and Steri-Strips. Dressing was then applied, the patient was awakened from general anesthesia and transported to the recovery room in stable condition. At the end of the procedure all sponge, needle and instrument counts were correct.   PLAN OF CARE: admit to inpatient  PATIENT DISPOSITION:  PACU - hemodynamically stable.   Delay start of Pharmacological VTE agent (>24hrs) due to surgical blood loss or risk of bleeding:  yes

## 2023-02-28 NOTE — Anesthesia Preprocedure Evaluation (Addendum)
 Anesthesia Evaluation  Patient identified by MRN, date of birth, ID band Patient awake    Reviewed: Allergy & Precautions, NPO status , Patient's Chart, lab work & pertinent test results  Airway Mallampati: II  TM Distance: >3 FB Neck ROM: Full    Dental no notable dental hx.    Pulmonary neg pulmonary ROS   Pulmonary exam normal        Cardiovascular hypertension, Pt. on medications Normal cardiovascular exam     Neuro/Psych  PSYCHIATRIC DISORDERS Anxiety     negative neurological ROS     GI/Hepatic Neg liver ROS,GERD  Medicated and Controlled,,  Endo/Other  negative endocrine ROS    Renal/GU negative Renal ROS     Musculoskeletal  (+) Arthritis ,    Abdominal  (+) + obese  Peds  Hematology negative hematology ROS (+)   Anesthesia Other Findings Lumbar Stenosis  Reproductive/Obstetrics                             Anesthesia Physical Anesthesia Plan  ASA: 3  Anesthesia Plan: General   Post-op Pain Management:    Induction: Intravenous  PONV Risk Score and Plan: 3 and Ondansetron , Dexamethasone , Midazolam  and Treatment may vary due to age or medical condition  Airway Management Planned: Oral ETT  Additional Equipment:   Intra-op Plan:   Post-operative Plan: Extubation in OR  Informed Consent: I have reviewed the patients History and Physical, chart, labs and discussed the procedure including the risks, benefits and alternatives for the proposed anesthesia with the patient or authorized representative who has indicated his/her understanding and acceptance.     Dental advisory given  Plan Discussed with: CRNA  Anesthesia Plan Comments:        Anesthesia Quick Evaluation

## 2023-02-28 NOTE — Anesthesia Postprocedure Evaluation (Signed)
 Anesthesia Post Note  Patient: KAARIN PARDY  Procedure(s) Performed: Posterior Lumbar Interbody Fusion Lumbar Two-Lumbar Three, Posterior Lateral and Interbody Fusion, Extension of Fusion (Spine Lumbar)     Patient location during evaluation: PACU Anesthesia Type: General Level of consciousness: awake and alert Pain management: pain level controlled Vital Signs Assessment: post-procedure vital signs reviewed and stable Respiratory status: spontaneous breathing, nonlabored ventilation, respiratory function stable and patient connected to nasal cannula oxygen Cardiovascular status: blood pressure returned to baseline and stable Postop Assessment: no apparent nausea or vomiting Anesthetic complications: no  No notable events documented.  Last Vitals:  Vitals:   02/28/23 1515 02/28/23 1530  BP: (!) 95/54 116/72  Pulse: 74 73  Resp: 12 11  Temp:    SpO2: 91% 95%    Last Pain:  Vitals:   02/28/23 1530  TempSrc:   PainSc: 5                  Ryer Asato,W. EDMOND

## 2023-02-28 NOTE — Plan of Care (Signed)

## 2023-02-28 NOTE — Transfer of Care (Signed)
 Immediate Anesthesia Transfer of Care Note  Patient: Lauren Carter  Procedure(s) Performed: Posterior Lumbar Interbody Fusion Lumbar Two-Lumbar Three, Posterior Lateral and Interbody Fusion, Extension of Fusion (Spine Lumbar)  Patient Location: PACU  Anesthesia Type:General  Level of Consciousness: awake, alert , and oriented  Airway & Oxygen Therapy: Patient Spontanous Breathing and Patient connected to nasal cannula oxygen  Post-op Assessment: Report given to RN and Post -op Vital signs reviewed and stable  Post vital signs: Reviewed and stable  Last Vitals:  Vitals Value Taken Time  BP 118/72 02/28/23 1445  Temp    Pulse 76 02/28/23 1447  Resp 13 02/28/23 1447  SpO2 91 % 02/28/23 1447  Vitals shown include unfiled device data.  Last Pain:  Vitals:   02/28/23 1107  TempSrc:   PainSc: 6       Patients Stated Pain Goal: 3 (02/28/23 1107)  Complications: No notable events documented.

## 2023-03-01 DIAGNOSIS — Z96652 Presence of left artificial knee joint: Secondary | ICD-10-CM | POA: Diagnosis not present

## 2023-03-01 DIAGNOSIS — M4316 Spondylolisthesis, lumbar region: Secondary | ICD-10-CM | POA: Diagnosis not present

## 2023-03-01 DIAGNOSIS — M4726 Other spondylosis with radiculopathy, lumbar region: Secondary | ICD-10-CM | POA: Diagnosis not present

## 2023-03-01 DIAGNOSIS — M48061 Spinal stenosis, lumbar region without neurogenic claudication: Secondary | ICD-10-CM | POA: Diagnosis not present

## 2023-03-01 DIAGNOSIS — Z79899 Other long term (current) drug therapy: Secondary | ICD-10-CM | POA: Diagnosis not present

## 2023-03-01 DIAGNOSIS — I10 Essential (primary) hypertension: Secondary | ICD-10-CM | POA: Diagnosis not present

## 2023-03-01 MED ORDER — HYDROCODONE-ACETAMINOPHEN 10-325 MG PO TABS
1.0000 | ORAL_TABLET | ORAL | Status: DC | PRN
  Administered 2023-03-01 – 2023-03-02 (×2): 1 via ORAL
  Filled 2023-03-01 (×3): qty 1

## 2023-03-01 MED ORDER — HYDROCODONE-ACETAMINOPHEN 10-325 MG PO TABS
1.0000 | ORAL_TABLET | ORAL | Status: DC | PRN
  Administered 2023-03-01 (×2): 1 via ORAL
  Filled 2023-03-01 (×2): qty 1

## 2023-03-01 NOTE — Plan of Care (Signed)
 Problem: Education: Goal: Knowledge of General Education information will improve Description: Including pain rating scale, medication(s)/side effects and non-pharmacologic comfort measures 03/01/2023 0737 by Sherleen Flor, RN Outcome: Completed/Met 02/28/2023 1858 by Sherleen Flor, RN Outcome: Progressing   Problem: Health Behavior/Discharge Planning: Goal: Ability to manage health-related needs will improve 03/01/2023 0737 by Sherleen Flor, RN Outcome: Completed/Met 02/28/2023 1858 by Sherleen Flor, RN Outcome: Progressing   Problem: Clinical Measurements: Goal: Ability to maintain clinical measurements within normal limits will improve 03/01/2023 0737 by Sherleen Flor, RN Outcome: Completed/Met 02/28/2023 1858 by Sherleen Flor, RN Outcome: Progressing Goal: Will remain free from infection 03/01/2023 0737 by Sherleen Flor, RN Outcome: Completed/Met 02/28/2023 1858 by Sherleen Flor, RN Outcome: Progressing Goal: Diagnostic test results will improve 03/01/2023 0737 by Sherleen Flor, RN Outcome: Completed/Met 02/28/2023 1858 by Sherleen Flor, RN Outcome: Progressing Goal: Respiratory complications will improve 03/01/2023 0737 by Sherleen Flor, RN Outcome: Completed/Met 02/28/2023 1858 by Sherleen Flor, RN Outcome: Progressing Goal: Cardiovascular complication will be avoided 03/01/2023 0737 by Sherleen Flor, RN Outcome: Completed/Met 02/28/2023 1858 by Sherleen Flor, RN Outcome: Progressing   Problem: Activity: Goal: Risk for activity intolerance will decrease 03/01/2023 0737 by Sherleen Flor, RN Outcome: Completed/Met 02/28/2023 1858 by Sherleen Flor, RN Outcome: Progressing   Problem: Nutrition: Goal: Adequate nutrition will be maintained 03/01/2023 0737 by Sherleen Flor, RN Outcome: Completed/Met 02/28/2023 1858 by Sherleen Flor, RN Outcome: Progressing   Problem: Coping: Goal: Level of anxiety will decrease 03/01/2023 0737 by Sherleen Flor, RN Outcome: Completed/Met 02/28/2023 1858 by Sherleen Flor, RN Outcome: Progressing   Problem: Elimination: Goal: Will not experience complications related to bowel motility 03/01/2023 0737 by Sherleen Flor, RN Outcome: Completed/Met 02/28/2023 1858 by Sherleen Flor, RN Outcome: Progressing Goal: Will not experience complications related to urinary retention 03/01/2023 0737 by Sherleen Flor, RN Outcome: Completed/Met 02/28/2023 1858 by Sherleen Flor, RN Outcome: Progressing   Problem: Pain Management: Goal: General experience of comfort will improve 03/01/2023 0737 by Sherleen Flor, RN Outcome: Completed/Met 02/28/2023 1858 by Sherleen Flor, RN Outcome: Progressing   Problem: Safety: Goal: Ability to remain free from injury will improve 03/01/2023 0737 by Sherleen Flor, RN Outcome: Completed/Met 02/28/2023 1858 by Sherleen Flor, RN Outcome: Progressing   Problem: Skin Integrity: Goal: Risk for impaired skin integrity will decrease 03/01/2023 0737 by Sherleen Flor, RN Outcome: Completed/Met 02/28/2023 1858 by Sherleen Flor, RN Outcome: Progressing   Problem: Education: Goal: Ability to verbalize activity precautions or restrictions will improve 03/01/2023 0737 by Sherleen Flor, RN Outcome: Completed/Met 02/28/2023 1858 by Sherleen Flor, RN Outcome: Progressing Goal: Knowledge of the prescribed therapeutic regimen will improve 03/01/2023 0737 by Sherleen Flor, RN Outcome: Completed/Met 02/28/2023 1858 by Sherleen Flor, RN Outcome: Progressing Goal: Understanding of discharge needs will improve 03/01/2023 0737 by Sherleen Flor, RN Outcome: Completed/Met 02/28/2023 1858 by Sherleen Flor, RN Outcome: Progressing   Problem: Activity: Goal: Ability to avoid complications of mobility impairment will improve 03/01/2023 0737 by Sherleen Flor, RN Outcome: Completed/Met 02/28/2023 1858 by Sherleen Flor, RN Outcome: Progressing Goal: Ability  to tolerate increased activity will improve 03/01/2023 0737 by Sherleen Flor, RN Outcome: Completed/Met 02/28/2023 1858 by Sherleen Flor, RN Outcome: Progressing Goal: Will remain free from falls 03/01/2023 0737 by Sherleen Flor, RN Outcome: Completed/Met 02/28/2023 1858 by Sherleen Flor, RN Outcome: Progressing   Problem: Bowel/Gastric: Goal: Gastrointestinal status for postoperative course will improve 03/01/2023 0737 by Sherleen Flor, RN Outcome: Completed/Met 02/28/2023 1858 by Sherleen Flor, RN Outcome: Progressing   Problem: Clinical Measurements: Goal: Ability to maintain clinical measurements  within normal limits will improve 03/01/2023 0737 by Sherleen Flor, RN Outcome: Completed/Met 02/28/2023 1858 by Sherleen Flor, RN Outcome: Progressing Goal: Postoperative complications will be avoided or minimized 03/01/2023 0737 by Sherleen Flor, RN Outcome: Completed/Met 02/28/2023 1858 by Sherleen Flor, RN Outcome: Progressing Goal: Diagnostic test results will improve 03/01/2023 0737 by Sherleen Flor, RN Outcome: Completed/Met 02/28/2023 1858 by Sherleen Flor, RN Outcome: Progressing   Problem: Pain Management: Goal: Pain level will decrease 03/01/2023 0737 by Sherleen Flor, RN Outcome: Completed/Met 02/28/2023 1858 by Sherleen Flor, RN Outcome: Progressing   Problem: Skin Integrity: Goal: Will show signs of wound healing 03/01/2023 0737 by Sherleen Flor, RN Outcome: Completed/Met 02/28/2023 1858 by Sherleen Flor, RN Outcome: Progressing   Problem: Health Behavior/Discharge Planning: Goal: Identification of resources available to assist in meeting health care needs will improve 03/01/2023 0737 by Sherleen Flor, RN Outcome: Completed/Met 02/28/2023 1858 by Sherleen Flor, RN Outcome: Progressing   Problem: Bladder/Genitourinary: Goal: Urinary functional status for postoperative course will improve 03/01/2023 0737 by Sherleen Flor,  RN Outcome: Completed/Met 02/28/2023 1858 by Sherleen Flor, RN Outcome: Progressing

## 2023-03-01 NOTE — Care Management Obs Status (Signed)
 MEDICARE OBSERVATION STATUS NOTIFICATION   Patient Details  Name: Lauren Carter MRN: [id number] Date of Birth: 04-27-1950   Medicare Observation Status Notification Given:  Yes    Lockie Pares, RN 03/01/2023, 3:39 PM

## 2023-03-01 NOTE — Evaluation (Signed)
 Occupational Therapy Evaluation Patient Details Name: Lauren SAGGESE MRN: [id number] DOB: 1950/12/25 Today's Date: 03/01/2023   History of Present Illness 73 yo F s/p re-do of PLIF.  PMH: PLIF, arthritis, GERD, HTN, HLD.   Clinical Impression   Patient admitted for the diagnosis above.  Despite complaint of pain, patient is doing quite well.  She is moving Mod I level with no assist, and needs Min A for lower body ADL, which is close to her baseline.  All education completed with good understanding.  No further OT needed in the acute setting, and no post acute OT recommended.         If plan is discharge home, recommend the following: Assist for transportation    Functional Status Assessment  Patient has not had a recent decline in their functional status  Equipment Recommendations  None recommended by OT    Recommendations for Other Services       Precautions / Restrictions Precautions Precautions: Back Precaution Booklet Issued: Yes (comment) Required Braces or Orthoses: Spinal Brace Spinal Brace: Lumbar corset Restrictions Weight Bearing Restrictions Per Provider Order: No      Mobility Bed Mobility Overal bed mobility: Modified Independent                  Transfers Overall transfer level: Modified independent Equipment used: None                      Balance Overall balance assessment: No apparent balance deficits (not formally assessed)                                         ADL either performed or assessed with clinical judgement   ADL               Lower Body Bathing: Minimal assistance       Lower Body Dressing: Minimal assistance                       Vision Patient Visual Report: No change from baseline       Perception Perception: Not tested       Praxis Praxis: Not tested       Pertinent Vitals/Pain Pain Assessment Pain Assessment: Faces Faces Pain Scale: Hurts little more Pain  Location: Incisional Pain Descriptors / Indicators: Grimacing, Guarding Pain Intervention(s): Monitored during session     Extremity/Trunk Assessment Upper Extremity Assessment Upper Extremity Assessment: Overall WFL for tasks assessed   Lower Extremity Assessment Lower Extremity Assessment: Overall WFL for tasks assessed   Cervical / Trunk Assessment Cervical / Trunk Assessment: Back Surgery   Communication Communication Communication: No apparent difficulties   Cognition Arousal: Alert Behavior During Therapy: WFL for tasks assessed/performed Overall Cognitive Status: Within Functional Limits for tasks assessed                                       General Comments   VSS on RA    Exercises     Shoulder Instructions      Home Living Family/patient expects to be discharged to:: Private residence Living Arrangements: Spouse/significant other Available Help at Discharge: Family;Available 24 hours/day Type of Home: House Home Access: Stairs to enter [employer] of Steps: 4   Home Layout: Multi-level;Bed/bath upstairs  Alternate Level Stairs-Rails: Right Bathroom Shower/Tub: Producer, Television/film/video: Handicapped height Bathroom Accessibility: Yes How Accessible: Accessible via walker Home Equipment: Rolling Walker (2 wheels);Adaptive equipment;Hand held shower head;Grab bars - tub/shower Adaptive Equipment: Reacher        Prior Functioning/Environment Prior Level of Function : Independent/Modified Independent                        OT Problem List: Pain      OT Treatment/Interventions:      OT Goals(Current goals can be found in the care plan section) Acute Rehab OT Goals Patient Stated Goal: Return home OT Goal Formulation: With patient Time For Goal Achievement: 03/05/23 Potential to Achieve Goals: Good  OT Frequency:      Co-evaluation              AM-PAC OT 6 Clicks Daily Activity     Outcome  Measure Help from another person eating meals?: None Help from another person taking care of personal grooming?: None Help from another person toileting, which includes using toliet, bedpan, or urinal?: None Help from another person bathing (including washing, rinsing, drying)?: A Little Help from another person to put on and taking off regular upper body clothing?: None Help from another person to put on and taking off regular lower body clothing?: A Little 6 Click Score: 22   End of Session Equipment Utilized During Treatment: Back brace Nurse Communication: Mobility status  Activity Tolerance: Patient tolerated treatment well Patient left: in chair;with call bell/phone within reach  OT Visit Diagnosis: Unsteadiness on feet (R26.81)                Time: 8988-8967 OT Time Calculation (min): 21 min Charges:  OT General Charges $OT Visit: 1 Visit OT Evaluation $OT Eval Moderate Complexity: 1 Mod  03/01/2023  RP, OTR/L  Acute Rehabilitation Services  Office:  [phone]   Charlie JONETTA Halsted 03/01/2023, 10:36 AM

## 2023-03-01 NOTE — Progress Notes (Signed)
 Subjective: Patient reports a lot of back pain and achey in legs  Objective: Vital signs in last 24 hours: Temp:  [97.3 F (36.3 C)-98.3 F (36.8 C)] 98.3 F (36.8 C) (01/09 0738) Pulse Rate:  [69-91] 77 (01/09 0738) Resp:  [11-20] 16 (01/09 0738) BP: (95-146)/(40-82) 101/59 (01/09 0738) SpO2:  [88 %-96 %] 92 % (01/09 0738) Weight:  [90.7 kg] 90.7 kg (01/08 1050)  Intake/Output from previous day: 01/08 0701 - 01/09 0700 In: 2060 [P.O.:960; I.V.:1000; IV Piggyback:100] Out: 200 [Blood:200] Intake/Output this shift: No intake/output data recorded.  Neurologic: Grossly normal  Lab Results: Lab Results  Component Value Date   WBC 10.8 (H) 02/19/2023   HGB 15.0 02/19/2023   HCT 46.5 (H) 02/19/2023   MCV 87.9 02/19/2023   PLT 240 02/19/2023   Lab Results  Component Value Date   INR 0.9 02/19/2023   BMET Lab Results  Component Value Date   NA 141 02/19/2023   K 3.9 02/19/2023   CL 102 02/19/2023   CO2 28 02/19/2023   GLUCOSE 87 02/19/2023   BUN 11 02/19/2023   CREATININE 0.77 02/19/2023   CALCIUM 10.4 (H) 02/19/2023    Studies/Results: DG Lumbar Spine 2-3 Views Result Date: 02/28/2023 CLINICAL DATA:  Lumbar fusion EXAM: LUMBAR SPINE - 2-3 VIEW COMPARISON:  03/31/2021 FLUOROSCOPY TIME:  Radiation Exposure Index (as provided by the fluoroscopic device): 25.5 mGy If the device does not provide the exposure index: Fluoroscopy Time:  30 seconds Number of Acquired Images:  5 FINDINGS: Initial images again demonstrate fusion from L3-S1. New pedicle screws are seen at L2 and L3. Interbody fusion at L2-3 is noted. Posterior fixation at L2-3 is noted. The previously seen posterior fixation elements have been removed. IMPRESSION: L2-3 fusion. Electronically Signed   By: Oneil Devonshire M.D.   On: 02/28/2023 18:09   DG C-Arm 1-60 Min-No Report Result Date: 02/28/2023 Fluoroscopy was utilized by the requesting physician.  No radiographic interpretation.   DG C-Arm 1-60 Min-No  Report Result Date: 02/28/2023 Fluoroscopy was utilized by the requesting physician.  No radiographic interpretation.    Assessment/Plan: Postop day 1 PLIF, work with therapy today. Work on pain control. Plan to discharge home tomorrow.    LOS: 0 days    Suzen Lacks Lashane Whelpley 03/01/2023, 8:00 AM

## 2023-03-02 ENCOUNTER — Other Ambulatory Visit (HOSPITAL_COMMUNITY): Payer: Self-pay

## 2023-03-02 DIAGNOSIS — M4726 Other spondylosis with radiculopathy, lumbar region: Secondary | ICD-10-CM | POA: Diagnosis not present

## 2023-03-02 MED ORDER — HYDROCODONE-ACETAMINOPHEN 10-325 MG PO TABS
1.0000 | ORAL_TABLET | ORAL | 0 refills | Status: DC | PRN
  Filled 2023-03-02: qty 40, 7d supply, fill #0

## 2023-03-02 MED ORDER — TIZANIDINE HCL 4 MG PO TABS
4.0000 mg | ORAL_TABLET | Freq: Three times a day (TID) | ORAL | 1 refills | Status: AC | PRN
  Filled 2023-03-02: qty 60, 20d supply, fill #0

## 2023-03-02 NOTE — Discharge Summary (Signed)
 Physician Discharge Summary  Patient ID: Lauren Carter MRN: [id number] DOB/AGE: 72/10/1950 72 y.o.  Admit date: 02/28/2023 Discharge date: 03/02/2023  Admission Diagnoses: lumbar radiculopathy    Discharge Diagnoses: same   Discharged Condition: good  Hospital Course: The patient was admitted on 02/28/2023 and taken to the operating room where the patient underwent PLIF L2-3. The patient tolerated the procedure well and was taken to the recovery room and then to the floor in stable condition. The hospital course was routine. There were no complications. The wound remained clean dry and intact. Pt had appropriate back soreness. No complaints of leg pain or new N/T/W. The patient remained afebrile with stable vital signs, and tolerated a regular diet. The patient continued to increase activities, and pain was well controlled with oral pain medications.   Consults: None  Significant Diagnostic Studies:  Results for orders placed or performed during the hospital encounter of 02/19/23  Surgical pcr screen   Collection Time: 02/19/23 11:05 AM   Specimen: Nasal Mucosa; Nasal Swab  Result Value Ref Range   MRSA, PCR NEGATIVE NEGATIVE   Staphylococcus aureus NEGATIVE NEGATIVE  Type and screen [facility]   Collection Time: 02/19/23 11:29 AM  Result Value Ref Range   ABO/RH(D) O POS    Antibody Screen NEG    Sample Expiration 03/05/2023,2359    Extend sample reason      NO TRANSFUSIONS OR PREGNANCY IN THE PAST 3 MONTHS Performed at [facility], [address], [place], [place] [address]   Protime-INR   Collection Time: 02/19/23 11:30 AM  Result Value Ref Range   Prothrombin Time 12.6 11.4 - 15.2 seconds   INR 0.9 0.8 - 1.2  Comprehensive metabolic panel per protocol   Collection Time: 02/19/23 11:30 AM  Result Value Ref Range   Sodium 141 135 - 145 mmol/L   Potassium 3.9 3.5 - 5.1 mmol/L   Chloride 102 98 - 111 mmol/L   CO2 28 22 - 32 mmol/L   Glucose,  Bld 87 70 - 99 mg/dL   BUN 11 8 - 23 mg/dL   Creatinine, Ser 9.22 0.44 - 1.00 mg/dL   Calcium 89.5 (H) 8.9 - 10.3 mg/dL   Total Protein 7.2 6.5 - 8.1 g/dL   Albumin  4.0 3.5 - 5.0 g/dL   AST 25 15 - 41 U/L   ALT 19 0 - 44 U/L   Alkaline Phosphatase 70 38 - 126 U/L   Total Bilirubin 0.9 0.0 - 1.2 mg/dL   GFR, Estimated >39 >39 mL/min   Anion gap 11 5 - 15  CBC per protocol   Collection Time: 02/19/23 11:30 AM  Result Value Ref Range   WBC 10.8 (H) 4.0 - 10.5 K/uL   RBC 5.29 (H) 3.87 - 5.11 MIL/uL   Hemoglobin 15.0 12.0 - 15.0 g/dL   HCT 53.4 (H) 63.9 - 53.9 %   MCV 87.9 80.0 - 100.0 fL   MCH 28.4 26.0 - 34.0 pg   MCHC 32.3 30.0 - 36.0 g/dL   RDW 85.6 88.4 - 84.4 %   Platelets 240 150 - 400 K/uL   nRBC 0.0 0.0 - 0.2 %    DG Lumbar Spine 2-3 Views Result Date: 02/28/2023 CLINICAL DATA:  Lumbar fusion EXAM: LUMBAR SPINE - 2-3 VIEW COMPARISON:  03/31/2021 FLUOROSCOPY TIME:  Radiation Exposure Index (as provided by the fluoroscopic device): 25.5 mGy If the device does not provide the exposure index: Fluoroscopy Time:  30 seconds Number of  Acquired Images:  5 FINDINGS: Initial images again demonstrate fusion from L3-S1. New pedicle screws are seen at L2 and L3. Interbody fusion at L2-3 is noted. Posterior fixation at L2-3 is noted. The previously seen posterior fixation elements have been removed. IMPRESSION: L2-3 fusion. Electronically Signed   By: Oneil Devonshire M.D.   On: 02/28/2023 18:09   DG C-Arm 1-60 Min-No Report Result Date: 02/28/2023 Fluoroscopy was utilized by the requesting physician.  No radiographic interpretation.   DG C-Arm 1-60 Min-No Report Result Date: 02/28/2023 Fluoroscopy was utilized by the requesting physician.  No radiographic interpretation.    Antibiotics:  Anti-infectives (From admission, onward)    Start     Dose/Rate Route Frequency Ordered Stop   02/28/23 1830  ceFAZolin  (ANCEF ) IVPB 2g/100 mL premix        2 g 200 mL/hr over 30 Minutes Intravenous Every  8 hours 02/28/23 1548 03/01/23 0339   02/28/23 1134  ceFAZolin  (ANCEF ) IVPB 2g/100 mL premix        2 g 200 mL/hr over 30 Minutes Intravenous On call to O.R. 02/28/23 1054 02/28/23 1312       Discharge Exam: Blood pressure 114/60, pulse 77, temperature 98.2 F (36.8 C), temperature source Oral, resp. rate 20, height 5' 4 (1.626 m), weight 90.7 kg, SpO2 91%. Neurologic: Grossly normal Dressing dry  Discharge Medications:   Allergies as of 03/02/2023       Reactions   Hydrocodone  Other (See Comments)   insomnia   Ibuprofen Other (See Comments)   Causes GI upset   Prednisone Other (See Comments)   Insomnia        Medication List     TAKE these medications    CALCIUM 600 + D PO Take 2 tablets by mouth in the morning.   DULoxetine  30 MG capsule Commonly known as: CYMBALTA  Take 1 capsule (30 mg total) by mouth daily. What changed: when to take this   gabapentin  800 MG tablet Commonly known as: Neurontin  Take 1 tablet (800 mg total) by mouth at bedtime for 14 days. Take nightly for 3 days, then up to 14 days as needed   GLUCOSAMINE-MSM PO Take 2 tablets by mouth in the morning.   hydrochlorothiazide  25 MG tablet Commonly known as: HYDRODIURIL  Take 1 tablet (25 mg total) by mouth daily.   HYDROcodone -acetaminophen  10-325 MG tablet Commonly known as: NORCO Take 1 tablet by mouth every 4 (four) hours as needed for moderate pain (pain score 4-6) or severe pain (pain score 7-10).   loratadine 10 MG tablet Commonly known as: CLARITIN Take 10 mg by mouth in the morning.   losartan  100 MG tablet Commonly known as: COZAAR  Take 1 tablet (100 mg total) by mouth at bedtime.   multivitamin with minerals Tabs tablet Take 1 tablet by mouth in the morning.   omeprazole 20 MG capsule Commonly known as: PRILOSEC Take 20 mg by mouth daily as needed (indigestion/heartburn.).   pravastatin  20 MG tablet Commonly known as: PRAVACHOL  TAKE ONE TABLET BY MOUTH ONE TIME  DAILY What changed: when to take this   PROBIOTIC PO Take 1 capsule by mouth in the morning.   SUPER B COMPLEX PO Take 1 capsule by mouth in the morning.   tiZANidine  4 MG capsule Commonly known as: ZANAFLEX  Take 1 capsule (4 mg total) by mouth 3 (three) times daily as needed for muscle spasms. What changed: when to take this   Vitamin D 50 MCG (2000 UT) tablet Take 2,000 Units by  mouth in the morning.               Durable Medical Equipment  (From admission, onward)           Start     Ordered   02/28/23 1549  DME Walker rolling  Once       Question:  Patient needs a walker to treat with the following condition  Answer:  S/P lumbar fusion   02/28/23 1548   02/28/23 1549  DME 3 n 1  Once        02/28/23 1548            Disposition: home   Final Dx: PLIF L2-3  Discharge Instructions     Call MD for:  difficulty breathing, headache or visual disturbances   Complete by: As directed    Call MD for:  persistant nausea and vomiting   Complete by: As directed    Call MD for:  redness, tenderness, or signs of infection (pain, swelling, redness, odor or green/yellow discharge around incision site)   Complete by: As directed    Call MD for:  severe uncontrolled pain   Complete by: As directed    Call MD for:  temperature >100.4   Complete by: As directed    Diet - low sodium heart healthy   Complete by: As directed    Increase activity slowly   Complete by: As directed    Remove dressing in 48 hours   Complete by: As directed           Signed: Alm GORMAN Molt 03/02/2023, 7:29 AM

## 2023-03-02 NOTE — Progress Notes (Signed)
 Patient alert and oriented, voiding adequately, skin clean, dry and intact without evidence of skin break down, or symptoms of complications - no redness or edema noted, only slight tenderness at site.  Patient states pain is manageable at time of discharge. Room was checked and accounted for all patient's belongings; discharge instructions concerning her medications, incision care, follow up appointment and when to call the doctor as needed were all discussed with patient by RN and she expressed understanding on the instructions given.

## 2023-03-05 ENCOUNTER — Encounter (HOSPITAL_COMMUNITY): Payer: Self-pay | Admitting: Neurological Surgery

## 2023-03-24 ENCOUNTER — Other Ambulatory Visit: Payer: Self-pay | Admitting: Nurse Practitioner

## 2023-04-09 ENCOUNTER — Ambulatory Visit: Payer: Medicare PPO | Admitting: Nurse Practitioner

## 2023-04-09 VITALS — BP 142/94 | HR 82 | Temp 97.5°F | Ht 64.0 in | Wt 209.2 lb

## 2023-04-09 DIAGNOSIS — R6883 Chills (without fever): Secondary | ICD-10-CM | POA: Insufficient documentation

## 2023-04-09 DIAGNOSIS — I10 Essential (primary) hypertension: Secondary | ICD-10-CM | POA: Diagnosis not present

## 2023-04-09 DIAGNOSIS — Z9889 Other specified postprocedural states: Secondary | ICD-10-CM | POA: Insufficient documentation

## 2023-04-09 LAB — CBC
HCT: 43.3 % (ref 36.0–46.0)
Hemoglobin: 14.3 g/dL (ref 12.0–15.0)
MCHC: 33.1 g/dL (ref 30.0–36.0)
MCV: 84.7 fL (ref 78.0–100.0)
Platelets: 257 10*3/uL (ref 150.0–400.0)
RBC: 5.11 Mil/uL (ref 3.87–5.11)
RDW: 14.9 % (ref 11.5–15.5)
WBC: 7.5 10*3/uL (ref 4.0–10.5)

## 2023-04-09 LAB — BASIC METABOLIC PANEL
BUN: 14 mg/dL (ref 6–23)
CO2: 28 meq/L (ref 19–32)
Calcium: 10 mg/dL (ref 8.4–10.5)
Chloride: 104 meq/L (ref 96–112)
Creatinine, Ser: 0.71 mg/dL (ref 0.40–1.20)
GFR: 85.34 mL/min (ref >60.00)
Glucose, Bld: 100 mg/dL — ABNORMAL HIGH (ref 70–99)
Potassium: 3.5 meq/L (ref 3.5–5.1)
Sodium: 142 meq/L (ref 135–145)

## 2023-04-09 MED ORDER — AMLODIPINE BESYLATE 2.5 MG PO TABS: 2.5000 mg | ORAL_TABLET | Freq: Every day | ORAL | 1 refills | Status: DC

## 2023-04-09 NOTE — Progress Notes (Signed)
Established Patient Office Visit  Subjective   Patient ID: Lauren Carter, female    DOB: 1950/08/01  Age: 73 y.o. MRN: [id number]  Chief Complaint  Patient presents with   Follow-up    HTN and HLD recheck. Readings have been 151/93 and stays in 140s recently. Pt states she still has back pain. Complains that BP medication is not effective.    HPI  HTN: Patient currently maintained on losartan 100 mg daily and hydrochlorothiazide 25 mg daily.  She has been checking her blood pressure at home. States that she has been checking blood pressures at home and above goal   Back pain: she is followed by Dr. Kern Reap and had surgery on 02/28/2023. She is taking hydrocodone twice a day at most. States that approx in the middle of the week her legs started aching and have been bothering her. States that she has been woken up. Pain is in both legs and stops at the knee.  Does have a follow up with Dr. Yetta Barre with xrays to see how everything is postoperatively    Review of Systems  Constitutional:  Positive for chills. Negative for fever.  Respiratory:  Negative for shortness of breath.   Cardiovascular:  Negative for chest pain.  Neurological:  Negative for dizziness and headaches.  Psychiatric/Behavioral:  Negative for hallucinations and suicidal ideas.       Objective:     BP (!) 142/94   Pulse 82   Temp (!) 97.5 F (36.4 C) (Oral)   Ht 5\' 4"  (1.626 m)   Wt 209 lb 3.2 oz (94.9 kg)   SpO2 97%   BMI 35.91 kg/m  BP Readings from Last 3 Encounters:  04/09/23 (!) 142/94  03/02/23 114/60  02/19/23 (!) 133/90   Wt Readings from Last 3 Encounters:  04/09/23 209 lb 3.2 oz (94.9 kg)  02/28/23 200 lb (90.7 kg)  02/19/23 209 lb 11.2 oz (95.1 kg)      Physical Exam Vitals and nursing note reviewed.  Constitutional:      Appearance: Normal appearance.  Cardiovascular:     Rate and Rhythm: Normal rate and regular rhythm.     Pulses:          Posterior tibial pulses are 2+ on the  right side and 2+ on the left side.     Heart sounds: Normal heart sounds.  Pulmonary:     Effort: Pulmonary effort is normal.     Breath sounds: Normal breath sounds.  Abdominal:     General: Bowel sounds are normal.  Neurological:     Mental Status: She is alert.      No results found for any visits on 04/09/23.    The ASCVD Risk score (Arnett DK, et al., 2019) failed to calculate for the following reasons:   Cannot find a previous HDL lab   Cannot find a previous total cholesterol lab    Assessment & Plan:   Problem List Items Addressed This Visit       Cardiovascular and Mediastinum   Primary hypertension - Primary   Patient currently maintained on losartan 100 mg daily and hydrochlorothiazide 25 mg daily.  Patient blood pressure has been elevated at home in office.  Will add on amlodipine 2.5 mg daily.  Continue checking blood pressure at home.      Relevant Medications   amLODipine (NORVASC) 2.5 MG tablet   Other Relevant Orders   CBC   Basic metabolic panel     Other  History of spinal surgery   Chills   Check basic blood work inclusive of CBC.  Patient had a CBC done prior to surgery with slight leukocytosis pending results      Relevant Orders   CBC   Basic metabolic panel    Return in about 3 months (around 07/07/2023) for BP recheck.    Audria Nine, NP

## 2023-04-09 NOTE — Patient Instructions (Addendum)
Nice to see you today  I will be in touch with the labs once I have them Continue checking your blood pressure daily  I want the top number to be under 140 and the bottom number under 90 most of the times   Follow up with me in 3 months

## 2023-04-09 NOTE — Assessment & Plan Note (Signed)
Check basic blood work inclusive of CBC.  Patient had a CBC done prior to surgery with slight leukocytosis pending results

## 2023-04-09 NOTE — Assessment & Plan Note (Signed)
Patient currently maintained on losartan 100 mg daily and hydrochlorothiazide 25 mg daily.  Patient blood pressure has been elevated at home in office.  Will add on amlodipine 2.5 mg daily.  Continue checking blood pressure at home.

## 2023-04-13 ENCOUNTER — Encounter: Payer: Self-pay | Admitting: Nurse Practitioner

## 2023-04-17 DIAGNOSIS — M5416 Radiculopathy, lumbar region: Secondary | ICD-10-CM | POA: Diagnosis not present

## 2023-04-17 DIAGNOSIS — Z6834 Body mass index (BMI) 34.0-34.9, adult: Secondary | ICD-10-CM | POA: Diagnosis not present

## 2023-04-19 ENCOUNTER — Encounter (HOSPITAL_COMMUNITY): Payer: Self-pay | Admitting: Neurological Surgery

## 2023-04-26 ENCOUNTER — Other Ambulatory Visit: Payer: Self-pay | Admitting: Nurse Practitioner

## 2023-05-07 ENCOUNTER — Other Ambulatory Visit: Payer: Self-pay | Admitting: Nurse Practitioner

## 2023-05-07 DIAGNOSIS — Z1231 Encounter for screening mammogram for malignant neoplasm of breast: Secondary | ICD-10-CM

## 2023-06-08 ENCOUNTER — Ambulatory Visit
Admission: RE | Admit: 2023-06-08 | Discharge: 2023-06-08 | Disposition: A | Discharge status: Home / Self Care | Source: Ambulatory Visit | Attending: Nurse Practitioner | Admitting: Nurse Practitioner

## 2023-06-08 DIAGNOSIS — Z1231 Encounter for screening mammogram for malignant neoplasm of breast: Secondary | ICD-10-CM | POA: Diagnosis not present

## 2023-06-13 ENCOUNTER — Other Ambulatory Visit: Payer: Self-pay | Admitting: Nurse Practitioner

## 2023-06-14 ENCOUNTER — Encounter: Payer: Self-pay | Admitting: Nurse Practitioner

## 2023-06-19 DIAGNOSIS — Z6834 Body mass index (BMI) 34.0-34.9, adult: Secondary | ICD-10-CM | POA: Diagnosis not present

## 2023-06-19 DIAGNOSIS — M5416 Radiculopathy, lumbar region: Secondary | ICD-10-CM | POA: Diagnosis not present

## 2023-06-22 DIAGNOSIS — Z860100 Personal history of colon polyps, unspecified: Secondary | ICD-10-CM | POA: Diagnosis not present

## 2023-06-22 DIAGNOSIS — K219 Gastro-esophageal reflux disease without esophagitis: Secondary | ICD-10-CM | POA: Diagnosis not present

## 2023-07-02 ENCOUNTER — Encounter (HOSPITAL_COMMUNITY): Payer: Self-pay

## 2023-07-09 ENCOUNTER — Ambulatory Visit: Payer: Medicare PPO | Admitting: Nurse Practitioner

## 2023-07-09 DIAGNOSIS — I10 Essential (primary) hypertension: Secondary | ICD-10-CM

## 2023-07-09 MED ORDER — AMLODIPINE BESYLATE 2.5 MG PO TABS: 2.5000 mg | ORAL_TABLET | Freq: Every day | ORAL | 2 refills | Status: AC

## 2023-07-09 NOTE — Progress Notes (Signed)
   Established Patient Office Visit  Subjective   Patient ID: Lauren Carter, female    DOB: [birth year]  Age: 73 y.o. MRN: [id number]  Chief Complaint  Patient presents with   Follow-up    Last BP reading 126/81.    Medication Refill    Amlodipine         HTN: patient was seen by me on 04/09/2023 and had an elevatd BP. She was currently on losartan  100 and hydrochlorothiazide  25 daily. We did add on amlodipine  2.5mg  daily  States that she feel fine. She has been taking her blood pressure medications as prescribed. She was checking it every day and getting good readings.  No lightheadedness or dizziness   Review of Systems  Constitutional:  Negative for chills and fever.  Respiratory:  Negative for shortness of breath.   Cardiovascular:  Negative for chest pain.  Neurological:  Negative for headaches.      Objective:     BP 110/78   Pulse 62   Temp 97.6 F (36.4 C) (Oral)   Ht 5\' 4"  (1.626 m)   Wt 207 lb (93.9 kg)   SpO2 97%   BMI 35.53 kg/m  BP Readings from Last 3 Encounters:  07/09/23 110/78  04/09/23 (!) 142/94  03/02/23 114/60   Wt Readings from Last 3 Encounters:  07/09/23 207 lb (93.9 kg)  04/09/23 209 lb 3.2 oz (94.9 kg)  02/28/23 200 lb (90.7 kg)   SpO2 Readings from Last 3 Encounters:  07/09/23 97%  04/09/23 97%  03/02/23 91%      Physical Exam Vitals and nursing note reviewed.  Constitutional:      Appearance: Normal appearance.  Cardiovascular:     Rate and Rhythm: Normal rate and regular rhythm.     Heart sounds: Normal heart sounds.  Pulmonary:     Effort: Pulmonary effort is normal.     Breath sounds: Normal breath sounds.  Abdominal:     General: Bowel sounds are normal.  Musculoskeletal:     Right lower leg: No edema.     Left lower leg: No edema.  Neurological:     Mental Status: She is alert.      No results found for any visits on 07/09/23.    The ASCVD Risk score (Arnett DK, et al., 2019) failed to calculate for  the following reasons:   Cannot find a previous HDL lab   Cannot find a previous total cholesterol lab    Assessment & Plan:   Problem List Items Addressed This Visit       Cardiovascular and Mediastinum   Primary hypertension   Patient currently maintained on amlodipine  2.5 mg daily, losartan  100 mg daily, hydrochlorothiazide  25 mg daily.  Blood pressure well-controlled.  Patient tolerating medication well.  No adverse drug events.  Continue medication as prescribed      Relevant Medications   amLODipine  (NORVASC ) 2.5 MG tablet    Return if symptoms worsen or fail to improve, for As scheduled.    Margarie Shay, NP

## 2023-07-09 NOTE — Assessment & Plan Note (Signed)
 Patient currently maintained on amlodipine  2.5 mg daily, losartan  100 mg daily, hydrochlorothiazide  25 mg daily.  Blood pressure well-controlled.  Patient tolerating medication well.  No adverse drug events.  Continue medication as prescribed

## 2023-07-09 NOTE — Patient Instructions (Addendum)
 Nice to see you today I have refilled the amlodipine  Follow up with me as scheduled later in the year

## 2023-08-21 DIAGNOSIS — L578 Other skin changes due to chronic exposure to nonionizing radiation: Secondary | ICD-10-CM | POA: Diagnosis not present

## 2023-08-21 DIAGNOSIS — D2271 Melanocytic nevi of right lower limb, including hip: Secondary | ICD-10-CM | POA: Diagnosis not present

## 2023-08-21 DIAGNOSIS — L814 Other melanin hyperpigmentation: Secondary | ICD-10-CM | POA: Diagnosis not present

## 2023-08-21 DIAGNOSIS — L570 Actinic keratosis: Secondary | ICD-10-CM | POA: Diagnosis not present

## 2023-08-21 DIAGNOSIS — D2239 Melanocytic nevi of other parts of face: Secondary | ICD-10-CM | POA: Diagnosis not present

## 2023-08-21 DIAGNOSIS — L821 Other seborrheic keratosis: Secondary | ICD-10-CM | POA: Diagnosis not present

## 2023-08-21 DIAGNOSIS — D225 Melanocytic nevi of trunk: Secondary | ICD-10-CM | POA: Diagnosis not present

## 2023-09-04 DIAGNOSIS — S8002XA Contusion of left knee, initial encounter: Secondary | ICD-10-CM | POA: Diagnosis not present

## 2023-09-04 DIAGNOSIS — M25562 Pain in left knee: Secondary | ICD-10-CM | POA: Diagnosis not present

## 2023-09-13 ENCOUNTER — Other Ambulatory Visit: Payer: Self-pay | Admitting: Nurse Practitioner

## 2023-09-20 DIAGNOSIS — H90A21 Sensorineural hearing loss, unilateral, right ear, with restricted hearing on the contralateral side: Secondary | ICD-10-CM | POA: Diagnosis not present

## 2023-09-20 DIAGNOSIS — H8101 Meniere's disease, right ear: Secondary | ICD-10-CM | POA: Diagnosis not present

## 2023-09-24 ENCOUNTER — Telehealth: Payer: Self-pay | Admitting: Nurse Practitioner

## 2023-09-24 NOTE — Telephone Encounter (Signed)
 Copied from CRM [id number]. Topic: General - Call Back - No Documentation >> Sep 24, 2023  2:33 PM Rea C wrote: Reason for CRM: [name] from Dr. Carolee Vaught's office calling to follow up on whether patient's provider signed and faxed back the mediation clearance fax yet.   Fax: [phone] attn: [name]

## 2023-09-26 ENCOUNTER — Telehealth: Payer: Self-pay

## 2023-09-26 MED ORDER — TRIAMTERENE-HCTZ 37.5-25 MG PO CAPS: 1.0000 | ORAL_CAPSULE | Freq: Every day | ORAL | 1 refills | Status: DC

## 2023-09-26 NOTE — Telephone Encounter (Signed)
 Left voicemail for patient to call the office back.

## 2023-09-26 NOTE — Telephone Encounter (Signed)
 Left detailed voicemail for patient to call the office back.

## 2023-09-26 NOTE — Telephone Encounter (Unsigned)
 Copied from CRM [id number]. Topic: General - Call Back - No Documentation >> Sep 24, 2023  2:33 PM Rea C wrote: Reason for CRM: [name] from Dr. Carolee Vaught's office calling to follow up on whether patient's provider signed and faxed back the mediation clearance fax yet.   Fax: [phone] attnBETHA [name] >> Sep 26, 2023  9:43 AM [name] wrote: Patient returning a call from Janae. The patient stated that she spoke with Dr. Nanetta office and they stated that they resent the fax. She is concerned of running out of blood pressure medication before she's able to get this resolved. Patient also stated that Dr. Milissa wanted patient to stop her current blood pressure medication and replace it with something else.

## 2023-09-26 NOTE — Addendum Note (Signed)
 Addended by: WENDEE LYNWOOD HERO on: 09/26/2023 03:05 PM   Modules accepted: Orders

## 2023-09-26 NOTE — Telephone Encounter (Signed)
-----   Message from [facility] sent at 09/24/2023  2:17 PM EDT ----- On the ENt encounter it mentions swithcing her hydrochlorothiazide  to diazide. I am to do this but want to see her in a month in office. If she is agreeable and scheduled let me know and I will switch the medications ----- Message ----- From: Tanda Zenaida CROME Sent: 09/21/2023   8:42 AM EDT To: Lynwood CHRISTELLA Crandall, NP

## 2023-09-26 NOTE — Telephone Encounter (Signed)
 Called pt.  See other encounter.  Pt is agreeable to switch medications. Pt is scheduled for appt in one month.

## 2023-09-26 NOTE — Telephone Encounter (Signed)
 Medication sent to the pharmacy.

## 2023-09-26 NOTE — Telephone Encounter (Signed)
 Called sandy from Dr. Johnetta office for ENT. Advised to refax the form. Lauren Carter stated that [facility] informed her that the fax was received and will be reviewed and signed.   I have no record of fax and nothing has been scanned into chart.

## 2023-09-26 NOTE — Telephone Encounter (Signed)
 Contacted pt.  Pt is agreeable to switch medications and is scheduled for OV on 10/30/23 @10AM .  Pt would also like labs done that day per ENT office.

## 2023-10-08 ENCOUNTER — Ambulatory Visit: Payer: Medicare PPO | Admitting: Nurse Practitioner

## 2023-10-09 ENCOUNTER — Ambulatory Visit: Payer: Medicare PPO | Admitting: Nurse Practitioner

## 2023-10-16 ENCOUNTER — Other Ambulatory Visit: Payer: Self-pay | Admitting: Nurse Practitioner

## 2023-10-19 ENCOUNTER — Encounter

## 2023-10-26 ENCOUNTER — Ambulatory Visit

## 2023-10-26 VITALS — BP 108/68 | Ht 64.0 in | Wt 209.4 lb

## 2023-10-26 DIAGNOSIS — Z1231 Encounter for screening mammogram for malignant neoplasm of breast: Secondary | ICD-10-CM

## 2023-10-26 DIAGNOSIS — Z0000 Encounter for general adult medical examination without abnormal findings: Secondary | ICD-10-CM | POA: Diagnosis not present

## 2023-10-26 DIAGNOSIS — Z13820 Encounter for screening for osteoporosis: Secondary | ICD-10-CM

## 2023-10-26 NOTE — Patient Instructions (Addendum)
 Lauren Carter,  Thank you for taking the time for your Medicare Wellness Visit. I appreciate your continued commitment to your health goals. Please review the care plan we discussed, and feel free to reach out if I can assist you further.  Medicare recommends these wellness visits once per year to help you and your care team stay ahead of potential health issues. These visits are designed to focus on prevention, allowing your provider to concentrate on managing your acute and chronic conditions during your regular appointments.  Please note that Annual Wellness Visits do not include a physical exam. Some assessments may be limited, especially if the visit was conducted virtually. If needed, we may recommend a separate in-person follow-up with your provider.  Ongoing Care Seeing your primary care provider every 3 to 6 months helps us  monitor your health and provide consistent, personalized care. Next appt 10/30/23 @8 :50am  Referrals If a referral was made during today's visit and you haven't received any updates within two weeks, please contact the referred provider directly to check on the status.  Recommended Screenings:  Health Maintenance  Topic Date Due   Hepatitis C Screening  Never done   DEXA scan (bone density measurement)  Never done   Flu Shot  09/21/2023   COVID-19 Vaccine (11 - 2024-25 season) 10/22/2023   Medicare Annual Wellness Visit  10/25/2024   Mammogram  06/07/2025   DTaP/Tdap/Td vaccine (4 - Td or Tdap) 11/17/2028   Colon Cancer Screening  05/02/2032   Pneumococcal Vaccine for age over 70  Completed   Zoster (Shingles) Vaccine  Completed   HPV Vaccine  Aged Out   Meningitis B Vaccine  Aged Out       10/26/2023    8:31 AM  Advanced Directives  Does Patient Have a Medical Advance Directive? Yes  Type of Estate agent of [place];Living will  Copy of Healthcare Power of Attorney in Chart? No - copy requested   Advance Care Planning is important  because it: Ensures you receive medical care that aligns with your values, goals, and preferences. Provides guidance to your family and loved ones, reducing the emotional burden of decision-making during critical moments.  Vision: Annual vision screenings are recommended for early detection of glaucoma, cataracts, and diabetic retinopathy. These exams can also reveal signs of chronic conditions such as diabetes and high blood pressure.  Dental: Annual dental screenings help detect early signs of oral cancer, gum disease, and other conditions linked to overall health, including heart disease and diabetes.

## 2023-10-26 NOTE — Progress Notes (Addendum)
 Subjective:   Lauren Carter is a 73 y.o. who presents for a Medicare Wellness preventive visit.  As a reminder, Annual Wellness Visits don't include a physical exam, and some assessments may be limited, especially if this visit is performed virtually. We may recommend an in-person follow-up visit with your provider if needed.  Visit Complete: In person  Persons Participating in Visit: Patient.  AWV Questionnaire: No: Patient Medicare AWV questionnaire was not completed prior to this visit.  Cardiac Risk Factors include: advanced age (>35men, >17 women);dyslipidemia;hypertension;obesity (BMI >30kg/m2);sedentary lifestyle     Objective:    Today's Vitals   10/26/23 0818  BP: 108/68  Weight: 209 lb 6.4 oz (95 kg)  Height: 5' 4 (1.626 m)   Body mass index is 35.94 kg/m.     10/26/2023    8:31 AM 02/19/2023   10:50 AM 04/21/2020    9:14 AM 04/19/2020    8:21 AM 11/16/2017    7:29 AM 02/26/2017    1:36 PM  Advanced Directives  Does Patient Have a Medical Advance Directive? Yes Yes Yes Yes No    Type of Estate agent of [place];Living will Healthcare Power of [place];Living will Healthcare Power of [place];Living will Healthcare Power of [place];Living will  Healthcare Power of [place];Living will  Does patient want to make changes to medical advance directive?    No - Patient declined  No - Patient declined   Copy of Healthcare Power of Attorney in Chart? No - copy requested No - copy requested  Yes - validated most recent copy scanned in chart (See row information)  No - copy requested   Would patient like information on creating a medical advance directive?     No - Patient declined       Data saved with a previous flowsheet row definition    Current Medications (verified) Outpatient Encounter Medications as of 10/26/2023  Medication Sig   amLODipine  (NORVASC ) 2.5 MG tablet Take 1 tablet (2.5 mg total) by mouth daily.   B Complex-C (SUPER B COMPLEX  PO) Take 1 capsule by mouth in the morning.   Calcium Carb-Cholecalciferol (CALCIUM 600 + D PO) Take 2 tablets by mouth in the morning.   DULoxetine  (CYMBALTA ) 30 MG capsule TAKE ONE CAPSULE BY MOUTH ONCE A DAY   Glucosamine HCl-MSM (GLUCOSAMINE-MSM PO) Take 2 tablets by mouth in the morning.   loratadine (CLARITIN) 10 MG tablet Take 10 mg by mouth in the morning.   losartan  (COZAAR ) 100 MG tablet TAKE ONE TABLET BY MOUTH DAILY AT BEDTIME   Multiple Vitamin (MULTIVITAMIN WITH MINERALS) TABS tablet Take 1 tablet by mouth in the morning.   omeprazole (PRILOSEC) 20 MG capsule Take 20 mg by mouth daily as needed (indigestion/heartburn.).   pravastatin  (PRAVACHOL ) 20 MG tablet TAKE ONE TABLET BY MOUTH ONCE A DAY   Probiotic Product (PROBIOTIC PO) Take 1 capsule by mouth in the morning.   tiZANidine  (ZANAFLEX ) 4 MG tablet Take 1 tablet (4 mg total) by mouth 3 (three) times daily as needed for muscle spasms.   triamterene -hydrochlorothiazide  (DYAZIDE) 37.5-25 MG capsule Take 1 each (1 capsule total) by mouth daily.   No facility-administered encounter medications on file as of 10/26/2023.    Allergies (verified) Hydrocodone , Ibuprofen, and Prednisone   History: Past Medical History:  Diagnosis Date   Arthritis    Complication of anesthesia    per patient had shaking after procedures   Cyst of ovary    GERD (gastroesophageal reflux disease)  History of kidney stones    Hyperlipidemia    Hypertension    Liver cyst    per imaging 08 /2018   Ovarian cyst    Right   Renal calculus, left    Wears contact lenses    Past Surgical History:  Procedure Laterality Date   CATARACT EXTRACTION W/ INTRAOCULAR LENS  IMPLANT, BILATERAL Bilateral    CYSTOSCOPY N/A 11/16/2017   Procedure: CYSTOSCOPY;  Surgeon: Verdon Keen, MD;  Location: [facility];  Service: Gynecology;  Laterality: N/A;   CYSTOSCOPY WITH RETROGRADE PYELOGRAM, URETEROSCOPY AND STENT PLACEMENT Left 02/26/2017   Procedure:  CYSTOSCOPY WITH RETROGRADE PYELOGRAM, URETEROSCOPY AND STENT PLACEMENT, STONE BASKET RETRIVAL;  Surgeon: Sherrilee Belvie CROME, MD;  Location: [facility];  Service: Urology;  Laterality: Left;   EXCISION MASS, RECONSTRUCTION LEFT INDEX FINGER NAIL BED  10-11-2010    dr murrell  [facility]   HAMMER TOE SURGERY Right 02/2016   HOLMIUM LASER APPLICATION Left 02/26/2017   Procedure: HOLMIUM LASER APPLICATION;  Surgeon: Sherrilee Belvie CROME, MD;  Location: [facility];  Service: Urology;  Laterality: Left;   KNEE ARTHROSCOPY Left 1992   LAPAROSCOPIC BILATERAL SALPINGO OOPHERECTOMY Bilateral 11/16/2017   Procedure: LAPAROSCOPIC BILATERAL SALPINGO OOPHORECTOMY;  Surgeon: Verdon Keen, MD;  Location: [facility];  Service: Gynecology;  Laterality: Bilateral;   LAPAROSCOPIC LYSIS OF ADHESIONS N/A 11/16/2017   Procedure: LAPAROSCOPIC LYSIS OF ADHESIONS;  Surgeon: Verdon Keen, MD;  Location: [facility];  Service: Gynecology;  Laterality: N/A;   POSTERIOR FUSION PEDICLE SCREW PLACEMENT N/A 02/28/2023   Procedure: Posterior Lumbar Interbody Fusion Lumbar Two-Lumbar Three, Posterior Lateral and Interbody Fusion, Extension of Fusion;  Surgeon: Joshua Alm Hamilton, MD;  Location: [facility] OR;  Service: Neurosurgery;  Laterality: N/A;   SPINAL FUSION  04/20/2020   TOTAL KNEE ARTHROPLASTY Left 04-14-2014    dr jinny. poggi  [facility]   VAGINAL HYSTERECTOMY  2000   Family History  Problem Relation Age of Onset   Breast cancer Mother 38   Social History   Socioeconomic History   Marital status: Married    Spouse name: Marty   Number of children: 3   Years of education: Not on file   Highest education level: Not on file  Occupational History   Not on file  Tobacco Use   Smoking status: Never   Smokeless tobacco: Never  Vaping Use   Vaping status: Never Used  Substance and Sexual Activity   Alcohol use: Yes    Alcohol/week: 1.0 standard drink of alcohol    Types: 1 Glasses of wine per week     Comment: occasional   Drug use: No   Sexual activity: Not on file  Other Topics Concern   Not on file  Social History Narrative   Retired          [employer]   Social Drivers of Health   Financial Resource Strain: Low Risk  (10/26/2023)   Overall Financial Resource Strain (CARDIA)    Difficulty of Paying Living Expenses: Not hard at all  Food Insecurity: No Food Insecurity (10/26/2023)   Hunger Vital Sign    Worried About Running Out of Food in the Last Year: Never true    Ran Out of Food in the Last Year: Never true  Transportation Needs: No Transportation Needs (10/26/2023)   PRAPARE - Administrator, Civil Service (Medical): No    Lack of Transportation (Non-Medical): No  Physical Activity: Insufficiently Active (  10/26/2023)   Exercise Vital Sign    Days of Exercise per Week: 3 days    Minutes of Exercise per Session: 30 min  Stress: No Stress Concern Present (10/26/2023)   [employer] of Occupational Health - Occupational Stress Questionnaire    Feeling of Stress: Not at all  Social Connections: Moderately Integrated (10/26/2023)   Social Connection and Isolation Panel    Frequency of Communication with Friends and Family: Twice a week    Frequency of Social Gatherings with Friends and Family: More than three times a week    Attends Religious Services: More than 4 times per year    Active Member of [employer] or Organizations: No    Attends Engineer, structural: Never    Marital Status: Married    Tobacco Counseling Counseling given: Not Answered    Clinical Intake:  Pre-visit preparation completed: Yes  Pain : No/denies pain     BMI - recorded: 35.94 Nutritional Status: BMI > 30  Obese Nutritional Risks: None Diabetes: No  No results found for: HGBA1C   How often do you need to have someone help you when you read instructions, pamphlets, or other written materials from your doctor or pharmacy?: 1 - Never  Interpreter  Needed?: No  Comments: lives with husband Information entered by :: B.Garlin Batdorf,LPN   Activities of Daily Living     10/26/2023    8:32 AM 02/19/2023   10:59 AM  In your present state of health, do you have any difficulty performing the following activities:  Hearing? 1   Vision? 0   Difficulty concentrating or making decisions? 0   Walking or climbing stairs? 1   Dressing or bathing? 0   Doing errands, shopping? 0 0  Preparing Food and eating ? N   Using the Toilet? N   In the past six months, have you accidently leaked urine? N   Do you have problems with loss of bowel control? N   Managing your Medications? N   Managing your Finances? N   Housekeeping or managing your Housekeeping? N     Patient Care Team: Wendee Lynwood HERO, NP as PCP - General (Nurse Practitioner) Lucio Franky PARAS, OD (Optometry)  I have updated your Care Teams any recent Medical Services you may have received from other providers in the past year.     Assessment:   This is a routine wellness examination for Lauren Carter.  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 Appt next week at [facility] for hearing aids Vision Screening - Comments:: Pt says their vision is good without glasses;readers only Dr  Lucio   Goals Addressed             This Visit's Progress    Exercise 3x per week (30 min per time)       Walk at [place] 3 days and lose weight       Depression Screen     10/26/2023    8:28 AM 07/09/2023    9:02 AM 04/09/2023   10:17 AM 10/04/2022   11:05 AM  PHQ 2/9 Scores  PHQ - 2 Score 0 0 0 0  PHQ- 9 Score  0 4 3    Fall Risk     10/26/2023    8:21 AM 07/09/2023    9:03 AM 07/09/2023    8:55 AM 04/09/2023   10:18 AM 10/04/2022  11:06 AM  Fall Risk   Falls in the past year? 0 0 0 0 1  Number falls in past yr: 0 0 0 0 0  Injury with Fall? 0 0 0 0  0  Risk for fall due to : No Fall Risks No Fall Risks No Fall Risks No Fall Risks Other (Comment)  Follow up Falls prevention discussed;Education provided Falls evaluation completed Falls evaluation completed Falls evaluation completed Falls evaluation completed    MEDICARE RISK AT HOME:  Medicare Risk at Home Any stairs in or around the home?: Yes If so, are there any without handrails?: Yes Home free of loose throw rugs in walkways, pet beds, electrical cords, etc?: Yes Adequate lighting in your home to reduce risk of falls?: Yes Life alert?: No Use of a cane, walker or w/c?: No Grab bars in the bathroom?: Yes Shower chair or bench in shower?: Yes Elevated toilet seat or a handicapped toilet?: Yes  TIMED UP AND GO:  Was the test performed?  Yes  Length of time to ambulate 10 feet: 12 sec Gait steady and fast without use of assistive device  Cognitive Function: 6CIT completed        10/26/2023    8:38 AM  6CIT Screen  What Year? 0 points  What month? 0 points  What time? 0 points  Count back from 20 0 points  Months in reverse 0 points  Repeat phrase 0 points  Total Score 0 points    Immunizations Immunization History  Administered Date(s) Administered   INFLUENZA, HIGH DOSE SEASONAL PF 11/27/2016, 11/09/2018   Influenza-Unspecified 11/20/2012, 11/30/2015, 12/08/2017, 11/11/2018, 11/10/2019, 12/04/2020, 11/28/2021, 12/04/2022   PFIZER Comirnaty(Gray Top)Covid-19 Tri-Sucrose Vaccine 03/28/2019, 04/18/2019, 12/09/2019   PFIZER(Purple Top)SARS-COV-2 Vaccination 05/25/2020   PNEUMOCOCCAL CONJUGATE-20 10/24/2016   Pfizer Covid-19 Vaccine Bivalent Booster 26yrs & up 12/08/2020, 07/13/2021   Pfizer(Comirnaty)Fall Seasonal Vaccine 12 years and older 07/13/2021, 12/16/2021, 08/12/2022   Pneumococcal Conjugate-13 10/24/2016   Pneumococcal Polysaccharide-23 10/17/2006, 11/29/2017   RSV,unspecified 09/19/2022   Td (Adult),unspecified 10/17/2006   Tdap 11/21/2010, 11/11/2018,  11/18/2018   Unspecified SARS-COV-2 Vaccination 12/18/2022   Zoster Recombinant(Shingrix) 06/23/2021, 12/05/2021   Zoster, Live 02/19/2012    Screening Tests Health Maintenance  Topic Date Due   Hepatitis C Screening  Never done   DEXA SCAN  Never done   Influenza Vaccine  09/21/2023   COVID-19 Vaccine (11 - 2024-25 season) 10/22/2023   Medicare Annual Wellness (AWV)  10/25/2024   MAMMOGRAM  06/07/2025   DTaP/Tdap/Td (4 - Td or Tdap) 11/17/2028   Colonoscopy  05/02/2032   Pneumococcal Vaccine: 50+ Years  Completed   Zoster Vaccines- Shingrix  Completed   HPV VACCINES  Aged Out   Meningococcal B Vaccine  Aged Out    Health Maintenance  Health Maintenance Due  Topic Date Due   Hepatitis C Screening  Never done   DEXA SCAN  Never done   Influenza Vaccine  09/21/2023   COVID-19 Vaccine (11 - 2024-25 season) 10/22/2023   Health Maintenance Items Addressed: None due at this time. Pt will receive vaccines mid October (as usual)  Additional Screening:  Vision Screening: Recommended annual ophthalmology exams for early detection of glaucoma and other disorders of the eye. Would you like a referral to an eye doctor? No    Dental Screening: Recommended annual dental exams for proper oral hygiene  Community Resource Referral / Chronic Care Management: CRR required this visit?  No   CCM required this visit?  No  Plan:    I have personally reviewed and noted the following in the patient's chart:   Medical and social history Use of alcohol, tobacco or illicit drugs  Current medications and supplements including opioid prescriptions. Patient is not currently taking opioid prescriptions. Functional ability and status Nutritional status Physical activity Advanced directives List of other physicians Hospitalizations, surgeries, and ER visits in previous 12 months Vitals Screenings to include cognitive, depression, and falls Referrals and appointments  In addition, I  have reviewed and discussed with patient certain preventive protocols, quality metrics, and best practice recommendations. A written personalized care plan for preventive services as well as general preventive health recommendations were provided to patient.   Erminio LITTIE Saris, LPN   [id number]   After Visit Summary: Declined :will view visit in MyChart  Notes: Nothing significant to report at this time.

## 2023-10-30 ENCOUNTER — Ambulatory Visit: Admitting: Nurse Practitioner

## 2023-10-30 ENCOUNTER — Encounter: Payer: Self-pay | Admitting: Nurse Practitioner

## 2023-10-30 VITALS — BP 122/80 | HR 57 | Temp 97.7°F | Ht 63.5 in | Wt 207.8 lb

## 2023-10-30 DIAGNOSIS — F411 Generalized anxiety disorder: Secondary | ICD-10-CM | POA: Diagnosis not present

## 2023-10-30 DIAGNOSIS — Z0000 Encounter for general adult medical examination without abnormal findings: Secondary | ICD-10-CM | POA: Diagnosis not present

## 2023-10-30 DIAGNOSIS — Z981 Arthrodesis status: Secondary | ICD-10-CM | POA: Diagnosis not present

## 2023-10-30 DIAGNOSIS — E785 Hyperlipidemia, unspecified: Secondary | ICD-10-CM

## 2023-10-30 DIAGNOSIS — I10 Essential (primary) hypertension: Secondary | ICD-10-CM

## 2023-10-30 LAB — COMPREHENSIVE METABOLIC PANEL WITH GFR
ALT: 16 U/L (ref 0–35)
AST: 18 U/L (ref 0–37)
Albumin: 4.5 g/dL (ref 3.5–5.2)
Alkaline Phosphatase: 69 U/L (ref 39–117)
BUN: 17 mg/dL (ref 6–23)
CO2: 29 meq/L (ref 19–32)
Calcium: 10.5 mg/dL (ref 8.4–10.5)
Chloride: 104 meq/L (ref 96–112)
Creatinine, Ser: 0.81 mg/dL (ref 0.40–1.20)
GFR: 72.57 mL/min (ref >60.00)
Glucose, Bld: 86 mg/dL (ref 70–99)
Potassium: 4 meq/L (ref 3.5–5.1)
Sodium: 141 meq/L (ref 135–145)
Total Bilirubin: 0.6 mg/dL (ref 0.2–1.2)
Total Protein: 7.1 g/dL (ref 6.0–8.3)

## 2023-10-30 LAB — CBC WITH DIFFERENTIAL/PLATELET
Basophils Absolute: 0.1 K/uL (ref 0.0–0.1)
Basophils Relative: 0.7 % (ref 0.0–3.0)
Eosinophils Absolute: 0.3 K/uL (ref 0.0–0.7)
Eosinophils Relative: 3.6 % (ref 0.0–5.0)
HCT: 43.7 % (ref 36.0–46.0)
Hemoglobin: 14.3 g/dL (ref 12.0–15.0)
Lymphocytes Relative: 13 % (ref 12.0–46.0)
Lymphs Abs: 1 K/uL (ref 0.7–4.0)
MCHC: 32.8 g/dL (ref 30.0–36.0)
MCV: 86 fl (ref 78.0–100.0)
Monocytes Absolute: 0.6 K/uL (ref 0.1–1.0)
Monocytes Relative: 7.7 % (ref 3.0–12.0)
Neutro Abs: 5.7 K/uL (ref 1.4–7.7)
Neutrophils Relative %: 75 % (ref 43.0–77.0)
Platelets: 220 K/uL (ref 150.0–400.0)
RBC: 5.09 Mil/uL (ref 3.87–5.11)
RDW: 15.3 % (ref 11.5–15.5)
WBC: 7.6 K/uL (ref 4.0–10.5)

## 2023-10-30 LAB — LIPID PANEL
Cholesterol: 200 mg/dL (ref 0–200)
HDL: 64.4 mg/dL (ref >39.00)
LDL Cholesterol: 97 mg/dL (ref 0–99)
NonHDL: 135.29
Total CHOL/HDL Ratio: 3
Triglycerides: 192 mg/dL — ABNORMAL HIGH (ref 0.0–149.0)
VLDL: 38.4 mg/dL (ref 0.0–40.0)

## 2023-10-30 LAB — TSH: TSH: 1.09 u[IU]/mL (ref 0.35–5.50)

## 2023-10-30 NOTE — Assessment & Plan Note (Signed)
 Patient current currently maintained on pravastatin  20 mg daily.  Pending lipid panel

## 2023-10-30 NOTE — Assessment & Plan Note (Signed)
 Patient currently maintained on amlodipine  2.5 mg daily, losartan  100 mg daily, triamterene -hydrochlorothiazide  37.5-25 mg daily.  Blood pressure controlled.  Continue medication as prescribed

## 2023-10-30 NOTE — Assessment & Plan Note (Signed)
 Patient currently maintained on duloxetine  30 mg daily.  Patient denies HI/SI/AVH.  Stable.  Continue medication as prescribed

## 2023-10-30 NOTE — Patient Instructions (Signed)
 Nice to see you today I will be in touch with the labs  Follow up with me in 6 months, sooner if you need me

## 2023-10-30 NOTE — Assessment & Plan Note (Signed)
 Discussed age-appropriate immunizations and screening exams.  Did review patient's personal, surgical, social, family histories.  Patient is up-to-date on all age-appropriate vaccinations she would like.  Patient defers flu vaccine until later in the season.  Patient up-to-date on CRC screening.  She is aged of cervical cancer screening.  Presents today on breast cancer screening.  Order for mammogram and bone density scan ordered today.  Patient was given information at discharge about preventative healthcare maintenance with anticipatory guidance

## 2023-10-30 NOTE — Assessment & Plan Note (Signed)
 History of the same.  Stable

## 2023-10-30 NOTE — Progress Notes (Signed)
 Established Patient Office Visit  Subjective   Patient ID: Lauren Carter, female    DOB: 02-01-1951  Age: 73 y.o. MRN: [id number]  Chief Complaint  Patient presents with   Follow-up    Hypertension  118/76 this morning.    Medication Problem    Dyazide     HPI  HTN: Patient currently maintained on amlodipine  2.5 mg daily, losartan  100 mg daily, triamterene -hydrochlorothiazide  37.5-25 mg daily.  Mood: Currently maintained on duloxetine  30 mg daily.  GERD: Currently maintained on omeprazole 20 mg daily as needed  HLD: Currently maintained on pravastatin  20 mg daily  for complete physical and follow up of chronic conditions.  Immunizations: -Tetanus: Completed in 2020 -Influenza: Deferred until later in the season -Shingles: Completed Shingrix series -Pneumonia: Completed 2018  Diet: Fair diet. She is eating 2 meals a day. She will snack. She will drink water, green tea, sodas Exercise: No regular exercise. Walking around the neighborhood  Eye exam: Completes annually. Dr. Corporal. Cataracts and glasses   Dental exam: Completes semi-annually    Colonoscopy: Completed in 05/10/2022 with Dr. Elsie Cree with [facility] Cancer Screening: N/A  Pap smear: Hysterectomy, aged out  Mammogram: 06/08/2023, repeat 1 year.  Order placed  DEXA: Order has been placed she can get with next mammogram  Advance directive: does have one   Sleep: goes to bed aroun 12-1.then will get up 830-9 and sometimes will take a nap after lunch       Review of Systems  Constitutional:  Negative for chills and fever.  Respiratory:  Negative for shortness of breath.   Cardiovascular:  Negative for chest pain and leg swelling.  Gastrointestinal:  Negative for abdominal pain, blood in stool, constipation, diarrhea, nausea and vomiting.       Bm daily   Genitourinary:  Negative for dysuria and hematuria.  Neurological:  Negative for dizziness, tingling and headaches.   Psychiatric/Behavioral:  Negative for hallucinations and suicidal ideas.       Objective:     BP 122/80   Pulse (!) 57   Temp 97.7 F (36.5 C) (Oral)   Ht 5' 3.5 (1.613 m)   Wt 207 lb 12.8 oz (94.3 kg)   SpO2 98%   BMI 36.23 kg/m  BP Readings from Last 3 Encounters:  10/30/23 122/80  10/26/23 108/68  07/09/23 110/78   Wt Readings from Last 3 Encounters:  10/30/23 207 lb 12.8 oz (94.3 kg)  10/26/23 209 lb 6.4 oz (95 kg)  07/09/23 207 lb (93.9 kg)   SpO2 Readings from Last 3 Encounters:  10/30/23 98%  07/09/23 97%  04/09/23 97%      Physical Exam Vitals and nursing note reviewed.  Constitutional:      Appearance: Normal appearance.  HENT:     Right Ear: Tympanic membrane, ear canal and external ear normal.     Left Ear: Tympanic membrane, ear canal and external ear normal.     Mouth/Throat:     Mouth: Mucous membranes are moist.     Pharynx: Oropharynx is clear.  Eyes:     Extraocular Movements: Extraocular movements intact.     Pupils: Pupils are equal, round, and reactive to light.  Cardiovascular:     Rate and Rhythm: Normal rate and regular rhythm.     Pulses: Normal pulses.     Heart sounds: Normal heart sounds.  Pulmonary:     Effort: Pulmonary effort is normal.     Breath sounds: Normal breath sounds.  Abdominal:     General: Bowel sounds are normal. There is no distension.     Palpations: There is no mass.     Tenderness: There is no abdominal tenderness.     Hernia: No hernia is present.  Musculoskeletal:     Right lower leg: No edema.     Left lower leg: No edema.  Lymphadenopathy:     Cervical: No cervical adenopathy.  Skin:    General: Skin is warm.  Neurological:     General: No focal deficit present.     Mental Status: She is alert.     Deep Tendon Reflexes:     Reflex Scores:      Bicep reflexes are 2+ on the right side and 2+ on the left side.      Patellar reflexes are 2+ on the right side and 2+ on the left side.    Comments:  Bilateral upper and lower extremity strength 5/5  Psychiatric:        Mood and Affect: Mood normal.        Behavior: Behavior normal.        Thought Content: Thought content normal.        Judgment: Judgment normal.      No results found for any visits on 10/30/23.    The ASCVD Risk score (Arnett DK, et al., 2019) failed to calculate for the following reasons:   Cannot find a previous HDL lab   Cannot find a previous total cholesterol lab    Assessment & Plan:   Problem List Items Addressed This Visit       Cardiovascular and Mediastinum   Primary hypertension   Patient currently maintained on amlodipine  2.5 mg daily, losartan  100 mg daily, triamterene -hydrochlorothiazide  37.5-25 mg daily.  Blood pressure controlled.  Continue medication as prescribed      Relevant Orders   CBC with Differential/Platelet   Comprehensive metabolic panel with GFR   TSH     Other   S/P lumbar fusion   History of the same.  Stable      Preventative health care - Primary   Discussed age-appropriate immunizations and screening exams.  Did review patient's personal, surgical, social, family histories.  Patient is up-to-date on all age-appropriate vaccinations she would like.  Patient defers flu vaccine until later in the season.  Patient up-to-date on CRC screening.  She is aged of cervical cancer screening.  Presents today on breast cancer screening.  Order for mammogram and bone density scan ordered today.  Patient was given information at discharge about preventative healthcare maintenance with anticipatory guidance      Hyperlipidemia   Patient current currently maintained on pravastatin  20 mg daily.  Pending lipid panel      Relevant Orders   Lipid panel   GAD (generalized anxiety disorder)   Patient currently maintained on duloxetine  30 mg daily.  Patient denies HI/SI/AVH.  Stable.  Continue medication as prescribed      Relevant Orders   TSH    Return in about 6 months (around  04/28/2024) for BP recheck/GAD.    Adina Crandall, NP

## 2023-11-01 ENCOUNTER — Ambulatory Visit: Payer: Self-pay | Admitting: Nurse Practitioner

## 2023-11-06 DIAGNOSIS — Z01110 Encounter for hearing examination following failed hearing screening: Secondary | ICD-10-CM | POA: Diagnosis not present

## 2023-11-21 ENCOUNTER — Other Ambulatory Visit: Payer: Self-pay | Admitting: Nurse Practitioner

## 2023-12-05 ENCOUNTER — Other Ambulatory Visit: Payer: Self-pay | Admitting: Nurse Practitioner

## 2023-12-08 ENCOUNTER — Other Ambulatory Visit: Payer: Self-pay | Admitting: Nurse Practitioner

## 2024-01-17 ENCOUNTER — Other Ambulatory Visit: Payer: Self-pay | Admitting: Family

## 2024-01-23 ENCOUNTER — Telehealth: Payer: Self-pay

## 2024-01-23 ENCOUNTER — Other Ambulatory Visit: Payer: Self-pay | Admitting: Nurse Practitioner

## 2024-01-23 MED ORDER — DULOXETINE HCL 30 MG PO CPEP: ORAL_CAPSULE | ORAL | 1 refills | Status: AC

## 2024-01-23 NOTE — Telephone Encounter (Signed)
 Refill provided

## 2024-01-23 NOTE — Telephone Encounter (Signed)
 Copied from CRM #[id number]. Topic: Clinical - Medication Question >> Jan 23, 2024  2:57 PM Lauren Carter wrote: Reason for CRM: Patient is calling to follow up on a request the pharmacy submitted for DULoxetine  (CYMBALTA ) 30 MG capsule [[id number]], the pharmacy informed patient that multiple request have been sent to the office with no response. Patient has two weeks worth so she isn't running out yet.

## 2024-02-25 ENCOUNTER — Telehealth: Payer: Self-pay

## 2024-02-25 NOTE — Telephone Encounter (Signed)
 Copied from CRM [id number]. Topic: Clinical - Medical Advice >> Feb 25, 2024 10:07 AM Berwyn MATSU wrote: Reason for CRM: patient called in stating she has cold like symptoms and is asking what else she can do.

## 2024-02-25 NOTE — Telephone Encounter (Signed)
 Called and spoke with patient.  Gave her instructions on what OTC medications to take and what not to take.  Pt verbalized understanding and has no questions or concerns.

## 2024-02-25 NOTE — Telephone Encounter (Signed)
 Called pt. No answer and Unable to leave voicemail.

## 2024-02-25 NOTE — Telephone Encounter (Signed)
 She can take over the counter medications such as mucinex or coricidin HBP. She needs to avoid decongestants with the history of hypertension. Ia m more than happy to see her if she wants to be evaluated

## 2024-02-28 ENCOUNTER — Ambulatory Visit: Admitting: Nurse Practitioner

## 2024-02-28 VITALS — BP 122/90 | HR 85 | Temp 98.2°F | Ht 63.5 in | Wt 207.8 lb

## 2024-02-28 DIAGNOSIS — R52 Pain, unspecified: Secondary | ICD-10-CM

## 2024-02-28 DIAGNOSIS — R051 Acute cough: Secondary | ICD-10-CM

## 2024-02-28 DIAGNOSIS — R6883 Chills (without fever): Secondary | ICD-10-CM

## 2024-02-28 DIAGNOSIS — R0602 Shortness of breath: Secondary | ICD-10-CM | POA: Diagnosis not present

## 2024-02-28 DIAGNOSIS — J40 Bronchitis, not specified as acute or chronic: Secondary | ICD-10-CM | POA: Diagnosis not present

## 2024-02-28 DIAGNOSIS — J029 Acute pharyngitis, unspecified: Secondary | ICD-10-CM

## 2024-02-28 DIAGNOSIS — R509 Fever, unspecified: Secondary | ICD-10-CM

## 2024-02-28 LAB — POCT FLU A/B STATUS
Influenza A, POC: NEGATIVE
Influenza B, POC: NEGATIVE

## 2024-02-28 LAB — POC COVID19 BINAXNOW: SARS Coronavirus 2 Ag: NEGATIVE

## 2024-02-28 LAB — POCT RAPID STREP A (OFFICE): Rapid Strep A Screen: NEGATIVE

## 2024-02-28 MED ORDER — PREDNISONE 20 MG PO TABS: 40.0000 mg | ORAL_TABLET | Freq: Every day | ORAL | 0 refills | Status: AC

## 2024-02-28 MED ORDER — DOXYCYCLINE HYCLATE 100 MG PO TABS: 100.0000 mg | ORAL_TABLET | Freq: Two times a day (BID) | ORAL | 0 refills | Status: AC

## 2024-02-28 NOTE — Progress Notes (Signed)
 "  Established Patient Office Visit  Subjective   Patient ID: Lauren Carter, female    DOB: 06/13/1950  Age: 74 y.o. MRN: [id number]  Chief Complaint  Patient presents with   URI    Pt complains of headache, cough, burning sensation in chest, body aches, chills, and sinus issues with scratchy throat.      Discussed the use of AI scribe software for clinical note transcription with the patient, who gave verbal consent to proceed.  History of Present Illness Lauren Carter is a 74 year old female who presents with respiratory symptoms following a recent COVID vaccination.  She began experiencing symptoms on New Year's Day, approximately a week ago, following a COVID vaccine received last Monday. Initially, she felt achy, which she attributed to the vaccine, but this resolved. However, she developed a scratchy throat, nasal congestion, body aches, and a cough with a burning sensation. She reports feeling short of breath. She has been taking Mucinex, Tylenol , and consuming hot liquids with minimal relief.  She has experienced fever and chills, noting that she felt cold last night despite wearing warm clothing and using a blanket. This morning, she felt warm enough to turn on fans. She describes her energy level as low, with significant fatigue.  Her cough is productive, with yellowish sputum, and she is also blowing similar colored mucus from her nose. She reports some pain or pressure above or below the eyes, but not significant enough to be termed a headache. No nausea, vomiting, diarrhea, or abdominal pain.  She has been swabbed for flu, COVID, and strep, with initial results appearing negative. She has a history of taking prednisone  without issues, except for a past incident where a large dose caused stomach upset. She is currently using Robitussin for blood pressure and has Delsym at home for her cough.    With a history of HTN, hld, gad. Never smoker  Flu vaccine: Coivd vaccine:  original series and booster.   Review of Systems  Constitutional:  Positive for chills and malaise/fatigue. Negative for fever (subjective fever).  HENT:  Positive for sinus pain and sore throat.   Respiratory:  Positive for cough, sputum production and shortness of breath.   Gastrointestinal:  Negative for abdominal pain, diarrhea, nausea and vomiting.  Musculoskeletal:  Positive for myalgias.  Neurological:  Negative for headaches.      Objective:     BP (!) 122/90   Pulse 85   Temp 98.2 F (36.8 C) (Oral)   Ht 5' 3.5 (1.613 m)   Wt 207 lb 12.8 oz (94.3 kg)   SpO2 95%   BMI 36.23 kg/m    Physical Exam Vitals and nursing note reviewed.  Constitutional:      Appearance: Normal appearance.  HENT:     Right Ear: Tympanic membrane, ear canal and external ear normal.     Left Ear: Tympanic membrane, ear canal and external ear normal.     Nose:     Right Sinus: No maxillary sinus tenderness or frontal sinus tenderness.     Left Sinus: No maxillary sinus tenderness or frontal sinus tenderness.     Mouth/Throat:     Mouth: Mucous membranes are moist.     Pharynx: Oropharynx is clear.  Cardiovascular:     Rate and Rhythm: Normal rate and regular rhythm.     Heart sounds: Normal heart sounds.  Pulmonary:     Effort: Pulmonary effort is normal.     Breath sounds: Normal breath  sounds.  Lymphadenopathy:     Cervical: No cervical adenopathy.  Neurological:     Mental Status: She is alert.      No results found for any visits on 02/28/24.    The 10-year ASCVD risk score (Arnett DK, et al., 2019) is: 14%    Assessment & Plan:   Problem List Items Addressed This Visit       Other   Chills   Relevant Orders   POC COVID-19 BinaxNow   POCT Flu A & B Status   Other Visit Diagnoses       Acute cough    -  Primary   Relevant Orders   POC COVID-19 BinaxNow   POCT Flu A & B Status     Sore throat       Relevant Orders   POC COVID-19 BinaxNow   POCT Flu A & B  Status   Rapid Strep A     Body aches       Relevant Orders   POC COVID-19 BinaxNow   POCT Flu A & B Status     Subjective fever       Relevant Orders   POC COVID-19 BinaxNow   POCT Flu A & B Status     Bronchitis       Relevant Medications   predniSONE  (DELTASONE ) 20 MG tablet   doxycycline  (VIBRA -TABS) 100 MG tablet     Shortness of breath       Relevant Medications   predniSONE  (DELTASONE ) 20 MG tablet      Assessment and Plan Assessment & Plan Acute bronchitis Symptoms persisted for a week with shortness of breath. Negative for flu, COVID, and strep. Treatment warranted due to age and lack of improvement. - Prescribed doxycycline  100 mg orally twice daily for 7 days. - Prescribed steroids to alleviate shortness of breath. - Advised increased fluid intake to thin mucus. - Recommended Tylenol  as needed for body aches and fever. - Suggested warm saltwater gargles and cough drops for throat discomfort. - Advised use of Delsym for cough management. - Instructed to report if no improvement by Monday.  General Health Maintenance Up to date with COVID and flu vaccinations.  Return if symptoms worsen or fail to improve.    Adina Crandall, NP  "

## 2024-02-28 NOTE — Patient Instructions (Signed)
 Nice to see you today You flu, covid, and strep tests were negative in office I have sent tow prescriptions to the pharmacy Take the prednisone  in the morning with food Follow up if you do not improve

## 2024-03-03 ENCOUNTER — Ambulatory Visit: Payer: Self-pay

## 2024-03-03 DIAGNOSIS — R051 Acute cough: Secondary | ICD-10-CM

## 2024-03-03 DIAGNOSIS — R0602 Shortness of breath: Secondary | ICD-10-CM

## 2024-03-03 NOTE — Telephone Encounter (Signed)
 FYI Only or Action Required?: Action required by provider: clinical question for provider.  Patient was last seen in primary care on 02/28/2024 by Wendee Lynwood HERO, NP.  Called Nurse Triage reporting Shortness of Breath.  Symptoms began several days ago.  Interventions attempted: OTC medications: delsym and Prescription medications: doxycycline  and prednisone .  Symptoms are: unchanged.  Triage Disposition: See HCP Within 4 Hours (Or PCP Triage)  Patient/caregiver understands and will follow disposition?: No, wishes to speak with PCP    Copied from CRM #[id number]. Topic: Clinical - Red Word Triage >> Mar 03, 2024  4:29 PM China J wrote: [facility] that prompted transfer to Nurse Triage: Patient was prescribed medication for her symptoms but nothing has worked.  She is still having body aches, no energy, and trouble breathing. Reason for Disposition  [1] MILD difficulty breathing (e.g., minimal/no SOB at rest, SOB with walking, pulse < 100) AND [2] NEW-onset or WORSE than normal  Answer Assessment - Initial Assessment Questions Pt states Dr. Wendee told her to call back if she is not feeling better. Pt has been taking delsym, doxycycline , and steroid as prescribed. Cough has improved. All other symptoms are the same or worse. She states she is finally able to cough some stuff up but feels like she has to take a deep breath to catch her breath. Pulse ox 96 percent. She states deep breathing is not all the time. She states Dr. Wendee told her if not better next steps may be a chest xray. She does state that she has some heaviness at times. She denies any current higher acuity symptoms. RN did advise that if anything gets worse to go to the ER. Pt stated understanding but stated she is not that bad, just not getting any better and she feels like she should be by now.     1. RESPIRATORY STATUS: Describe your breathing? (e.g., wheezing, shortness of breath, unable to speak, severe coughing)       Shortness of breath, body aches 2. ONSET: When did this breathing problem begin?      Prior to last visit 3. PATTERN Does the difficult breathing come and go, or has it been constant since it started?      Comes and goes 4. SEVERITY: How bad is your breathing? (e.g., mild, moderate, severe)      Mild  5. RECURRENT SYMPTOM: Have you had difficulty breathing before? If Yes, ask: When was the last time? and What happened that time?      no 6. CARDIAC HISTORY: Do you have any history of heart disease? (e.g., heart attack, angina, bypass surgery, angioplasty)      htn 7. LUNG HISTORY: Do you have any history of lung disease?  (e.g., pulmonary embolus, asthma, emphysema)      8. CAUSE: What do you think is causing the breathing problem?      This current illness 9. OTHER SYMPTOMS: Do you have any other symptoms? (e.g., chest pain, cough, dizziness, fever, runny nose)     Cough, she is coughing some of it up, yellowish, body aches 10. O2 SATURATION MONITOR:  Do you use an oxygen saturation monitor (pulse oximeter) at home? If Yes, ask: What is your reading (oxygen level) today? What is your usual oxygen saturation reading? (e.g., 95%)       96  Protocols used: Breathing Difficulty-A-AH

## 2024-03-04 NOTE — Telephone Encounter (Signed)
 Can we get her in for a chest xray and an RSV swab?

## 2024-03-04 NOTE — Addendum Note (Signed)
 Addended by: WENDEE LYNWOOD HERO on: 03/04/2024 12:42 PM   Modules accepted: Orders

## 2024-03-04 NOTE — Telephone Encounter (Signed)
 Orders have been placed.

## 2024-03-04 NOTE — Addendum Note (Signed)
 Addended by: WENDEE LYNWOOD HERO on: 03/04/2024 04:22 PM   Modules accepted: Orders

## 2024-03-04 NOTE — Telephone Encounter (Signed)
 Scheduled pt with RSV and xray walk in for 03/05/24 @930  Please add RSV orders.

## 2024-03-05 ENCOUNTER — Ambulatory Visit

## 2024-03-05 ENCOUNTER — Other Ambulatory Visit

## 2024-03-05 ENCOUNTER — Ambulatory Visit (INDEPENDENT_AMBULATORY_CARE_PROVIDER_SITE_OTHER)
Admission: RE | Admit: 2024-03-05 | Discharge: 2024-03-05 | Disposition: A | Discharge status: Home / Self Care | Source: Ambulatory Visit | Attending: Nurse Practitioner | Admitting: Nurse Practitioner

## 2024-03-05 DIAGNOSIS — R051 Acute cough: Secondary | ICD-10-CM

## 2024-03-05 DIAGNOSIS — R0602 Shortness of breath: Secondary | ICD-10-CM | POA: Diagnosis not present

## 2024-03-07 ENCOUNTER — Ambulatory Visit: Payer: Self-pay | Admitting: Nurse Practitioner

## 2024-03-08 LAB — RESPIRATORY VIRUS PANEL

## 2024-04-28 ENCOUNTER — Ambulatory Visit: Admitting: Nurse Practitioner

## 2024-10-29 ENCOUNTER — Ambulatory Visit

## 2024-11-25 ENCOUNTER — Ambulatory Visit
